# Patient Record
Sex: Male | Born: 1945
Health system: Southern US, Community
[De-identification: ages and names within clinical notes are randomized; demographics above are authoritative.]

## PROBLEM LIST (undated history)

## (undated) DIAGNOSIS — H9190 Unspecified hearing loss, unspecified ear: Secondary | ICD-10-CM

## (undated) DIAGNOSIS — G4730 Sleep apnea, unspecified: Secondary | ICD-10-CM

## (undated) DIAGNOSIS — H3322 Serous retinal detachment, left eye: Secondary | ICD-10-CM

## (undated) DIAGNOSIS — C61 Malignant neoplasm of prostate: Secondary | ICD-10-CM

## (undated) DIAGNOSIS — M1990 Unspecified osteoarthritis, unspecified site: Secondary | ICD-10-CM

## (undated) DIAGNOSIS — E1129 Type 2 diabetes mellitus with other diabetic kidney complication: Secondary | ICD-10-CM

## (undated) DIAGNOSIS — H409 Unspecified glaucoma: Secondary | ICD-10-CM

## (undated) DIAGNOSIS — R112 Nausea with vomiting, unspecified: Secondary | ICD-10-CM

## (undated) DIAGNOSIS — E785 Hyperlipidemia, unspecified: Secondary | ICD-10-CM

## (undated) DIAGNOSIS — Z87891 Personal history of nicotine dependence: Secondary | ICD-10-CM

## (undated) DIAGNOSIS — I10 Essential (primary) hypertension: Secondary | ICD-10-CM

## (undated) DIAGNOSIS — Z9889 Other specified postprocedural states: Secondary | ICD-10-CM

## (undated) DIAGNOSIS — K219 Gastro-esophageal reflux disease without esophagitis: Secondary | ICD-10-CM

## (undated) DIAGNOSIS — E119 Type 2 diabetes mellitus without complications: Secondary | ICD-10-CM

## (undated) HISTORY — DX: Hyperlipidemia, unspecified: E78.5

## (undated) HISTORY — DX: Personal history of nicotine dependence: Z87.891

## (undated) HISTORY — DX: Unspecified glaucoma: H40.9

## (undated) HISTORY — DX: Type 2 diabetes mellitus with other diabetic kidney complication: E11.29

## (undated) HISTORY — PX: CATARACT EXTRACTION, BILATERAL: SHX1313

## (undated) HISTORY — PX: EYE SURGERY: SHX253

## (undated) HISTORY — PX: CHOLECYSTECTOMY: SHX55

## (undated) HISTORY — DX: Type 2 diabetes mellitus without complications: E11.9

## (undated) HISTORY — PX: VASECTOMY: SHX75

## (undated) HISTORY — PX: HERNIA REPAIR: SHX51

## (undated) HISTORY — PX: RETINAL DETACHMENT SURGERY: SHX105

---

## 1999-08-17 ENCOUNTER — Encounter: Payer: Self-pay | Admitting: Ophthalmology

## 1999-08-18 ENCOUNTER — Ambulatory Visit (HOSPITAL_COMMUNITY): Admission: RE | Admit: 1999-08-18 | Discharge: 1999-08-19 | Payer: Self-pay | Admitting: Ophthalmology

## 1999-10-14 ENCOUNTER — Ambulatory Visit (HOSPITAL_COMMUNITY): Admission: RE | Admit: 1999-10-14 | Discharge: 1999-10-15 | Payer: Self-pay | Admitting: Ophthalmology

## 2002-08-27 ENCOUNTER — Ambulatory Visit (HOSPITAL_BASED_OUTPATIENT_CLINIC_OR_DEPARTMENT_OTHER): Admission: RE | Admit: 2002-08-27 | Discharge: 2002-08-27 | Payer: Self-pay | Admitting: Family Medicine

## 2015-06-21 DIAGNOSIS — K219 Gastro-esophageal reflux disease without esophagitis: Secondary | ICD-10-CM | POA: Diagnosis not present

## 2015-06-21 DIAGNOSIS — I10 Essential (primary) hypertension: Secondary | ICD-10-CM | POA: Diagnosis not present

## 2015-07-02 DIAGNOSIS — H26491 Other secondary cataract, right eye: Secondary | ICD-10-CM | POA: Diagnosis not present

## 2015-07-02 DIAGNOSIS — H40053 Ocular hypertension, bilateral: Secondary | ICD-10-CM | POA: Diagnosis not present

## 2015-07-26 DIAGNOSIS — Z79899 Other long term (current) drug therapy: Secondary | ICD-10-CM | POA: Diagnosis not present

## 2015-07-26 DIAGNOSIS — I10 Essential (primary) hypertension: Secondary | ICD-10-CM | POA: Diagnosis not present

## 2015-08-30 DIAGNOSIS — M545 Low back pain: Secondary | ICD-10-CM | POA: Diagnosis not present

## 2015-08-30 DIAGNOSIS — M5136 Other intervertebral disc degeneration, lumbar region: Secondary | ICD-10-CM | POA: Diagnosis not present

## 2015-10-25 DIAGNOSIS — M545 Low back pain: Secondary | ICD-10-CM | POA: Diagnosis not present

## 2015-10-25 DIAGNOSIS — Z79899 Other long term (current) drug therapy: Secondary | ICD-10-CM | POA: Diagnosis not present

## 2015-10-25 DIAGNOSIS — I10 Essential (primary) hypertension: Secondary | ICD-10-CM | POA: Diagnosis not present

## 2015-10-29 DIAGNOSIS — M545 Low back pain: Secondary | ICD-10-CM | POA: Diagnosis not present

## 2015-11-10 DIAGNOSIS — M545 Low back pain: Secondary | ICD-10-CM | POA: Diagnosis not present

## 2015-11-22 DIAGNOSIS — M545 Low back pain: Secondary | ICD-10-CM | POA: Diagnosis not present

## 2015-12-01 DIAGNOSIS — M5136 Other intervertebral disc degeneration, lumbar region: Secondary | ICD-10-CM | POA: Diagnosis not present

## 2015-12-01 DIAGNOSIS — M545 Low back pain: Secondary | ICD-10-CM | POA: Diagnosis not present

## 2015-12-03 DIAGNOSIS — M545 Low back pain: Secondary | ICD-10-CM | POA: Diagnosis not present

## 2015-12-03 DIAGNOSIS — M5136 Other intervertebral disc degeneration, lumbar region: Secondary | ICD-10-CM | POA: Diagnosis not present

## 2015-12-07 DIAGNOSIS — M5136 Other intervertebral disc degeneration, lumbar region: Secondary | ICD-10-CM | POA: Diagnosis not present

## 2015-12-07 DIAGNOSIS — M545 Low back pain: Secondary | ICD-10-CM | POA: Diagnosis not present

## 2015-12-14 DIAGNOSIS — M545 Low back pain: Secondary | ICD-10-CM | POA: Diagnosis not present

## 2016-03-31 DIAGNOSIS — E785 Hyperlipidemia, unspecified: Secondary | ICD-10-CM | POA: Diagnosis not present

## 2016-03-31 DIAGNOSIS — Z79899 Other long term (current) drug therapy: Secondary | ICD-10-CM | POA: Diagnosis not present

## 2016-03-31 DIAGNOSIS — I10 Essential (primary) hypertension: Secondary | ICD-10-CM | POA: Diagnosis not present

## 2016-03-31 DIAGNOSIS — Z125 Encounter for screening for malignant neoplasm of prostate: Secondary | ICD-10-CM | POA: Diagnosis not present

## 2016-03-31 DIAGNOSIS — K219 Gastro-esophageal reflux disease without esophagitis: Secondary | ICD-10-CM | POA: Diagnosis not present

## 2016-03-31 DIAGNOSIS — R7301 Impaired fasting glucose: Secondary | ICD-10-CM | POA: Diagnosis not present

## 2016-04-04 DIAGNOSIS — H524 Presbyopia: Secondary | ICD-10-CM | POA: Diagnosis not present

## 2016-04-04 DIAGNOSIS — H26491 Other secondary cataract, right eye: Secondary | ICD-10-CM | POA: Diagnosis not present

## 2016-04-04 DIAGNOSIS — H40053 Ocular hypertension, bilateral: Secondary | ICD-10-CM | POA: Diagnosis not present

## 2016-05-25 DIAGNOSIS — R972 Elevated prostate specific antigen [PSA]: Secondary | ICD-10-CM | POA: Diagnosis not present

## 2016-05-30 DIAGNOSIS — H40053 Ocular hypertension, bilateral: Secondary | ICD-10-CM | POA: Diagnosis not present

## 2016-05-30 DIAGNOSIS — H26491 Other secondary cataract, right eye: Secondary | ICD-10-CM | POA: Diagnosis not present

## 2016-07-28 DIAGNOSIS — N400 Enlarged prostate without lower urinary tract symptoms: Secondary | ICD-10-CM | POA: Diagnosis not present

## 2016-07-28 DIAGNOSIS — R972 Elevated prostate specific antigen [PSA]: Secondary | ICD-10-CM | POA: Diagnosis not present

## 2016-08-03 DIAGNOSIS — H52209 Unspecified astigmatism, unspecified eye: Secondary | ICD-10-CM | POA: Diagnosis not present

## 2016-08-03 DIAGNOSIS — H524 Presbyopia: Secondary | ICD-10-CM | POA: Diagnosis not present

## 2016-08-03 DIAGNOSIS — Z0100 Encounter for examination of eyes and vision without abnormal findings: Secondary | ICD-10-CM | POA: Diagnosis not present

## 2016-08-03 DIAGNOSIS — H5213 Myopia, bilateral: Secondary | ICD-10-CM | POA: Diagnosis not present

## 2016-09-04 DIAGNOSIS — C61 Malignant neoplasm of prostate: Secondary | ICD-10-CM | POA: Diagnosis not present

## 2016-09-04 DIAGNOSIS — R972 Elevated prostate specific antigen [PSA]: Secondary | ICD-10-CM | POA: Diagnosis not present

## 2016-09-22 DIAGNOSIS — C61 Malignant neoplasm of prostate: Secondary | ICD-10-CM | POA: Diagnosis not present

## 2016-10-20 DIAGNOSIS — C61 Malignant neoplasm of prostate: Secondary | ICD-10-CM | POA: Diagnosis not present

## 2016-10-20 DIAGNOSIS — R0683 Snoring: Secondary | ICD-10-CM | POA: Diagnosis not present

## 2016-10-20 DIAGNOSIS — Z79899 Other long term (current) drug therapy: Secondary | ICD-10-CM | POA: Diagnosis not present

## 2016-10-20 DIAGNOSIS — K219 Gastro-esophageal reflux disease without esophagitis: Secondary | ICD-10-CM | POA: Diagnosis not present

## 2016-10-20 DIAGNOSIS — I10 Essential (primary) hypertension: Secondary | ICD-10-CM | POA: Diagnosis not present

## 2016-11-19 DIAGNOSIS — G4733 Obstructive sleep apnea (adult) (pediatric): Secondary | ICD-10-CM | POA: Diagnosis not present

## 2016-11-29 DIAGNOSIS — H40053 Ocular hypertension, bilateral: Secondary | ICD-10-CM | POA: Diagnosis not present

## 2016-11-29 DIAGNOSIS — H26491 Other secondary cataract, right eye: Secondary | ICD-10-CM | POA: Diagnosis not present

## 2017-01-01 DIAGNOSIS — G4733 Obstructive sleep apnea (adult) (pediatric): Secondary | ICD-10-CM | POA: Diagnosis not present

## 2017-01-31 DIAGNOSIS — G4733 Obstructive sleep apnea (adult) (pediatric): Secondary | ICD-10-CM | POA: Diagnosis not present

## 2017-02-16 DIAGNOSIS — G4733 Obstructive sleep apnea (adult) (pediatric): Secondary | ICD-10-CM | POA: Diagnosis not present

## 2017-02-19 DIAGNOSIS — M545 Low back pain: Secondary | ICD-10-CM | POA: Diagnosis not present

## 2017-02-19 DIAGNOSIS — G8929 Other chronic pain: Secondary | ICD-10-CM | POA: Diagnosis not present

## 2017-02-19 DIAGNOSIS — M5136 Other intervertebral disc degeneration, lumbar region: Secondary | ICD-10-CM | POA: Diagnosis not present

## 2017-03-03 DIAGNOSIS — G4733 Obstructive sleep apnea (adult) (pediatric): Secondary | ICD-10-CM | POA: Diagnosis not present

## 2017-03-13 DIAGNOSIS — M545 Low back pain: Secondary | ICD-10-CM | POA: Diagnosis not present

## 2017-03-26 DIAGNOSIS — M545 Low back pain: Secondary | ICD-10-CM | POA: Diagnosis not present

## 2017-03-26 DIAGNOSIS — M5136 Other intervertebral disc degeneration, lumbar region: Secondary | ICD-10-CM | POA: Diagnosis not present

## 2017-03-26 DIAGNOSIS — G8929 Other chronic pain: Secondary | ICD-10-CM | POA: Diagnosis not present

## 2017-04-09 DIAGNOSIS — M545 Low back pain: Secondary | ICD-10-CM | POA: Diagnosis not present

## 2017-04-09 DIAGNOSIS — M5136 Other intervertebral disc degeneration, lumbar region: Secondary | ICD-10-CM | POA: Diagnosis not present

## 2017-04-17 DIAGNOSIS — M5136 Other intervertebral disc degeneration, lumbar region: Secondary | ICD-10-CM | POA: Diagnosis not present

## 2017-04-17 DIAGNOSIS — M545 Low back pain: Secondary | ICD-10-CM | POA: Diagnosis not present

## 2017-04-19 DIAGNOSIS — C61 Malignant neoplasm of prostate: Secondary | ICD-10-CM | POA: Diagnosis not present

## 2017-04-19 DIAGNOSIS — Z79899 Other long term (current) drug therapy: Secondary | ICD-10-CM | POA: Diagnosis not present

## 2017-04-19 DIAGNOSIS — K219 Gastro-esophageal reflux disease without esophagitis: Secondary | ICD-10-CM | POA: Diagnosis not present

## 2017-04-19 DIAGNOSIS — I10 Essential (primary) hypertension: Secondary | ICD-10-CM | POA: Diagnosis not present

## 2017-04-19 DIAGNOSIS — G4733 Obstructive sleep apnea (adult) (pediatric): Secondary | ICD-10-CM | POA: Diagnosis not present

## 2017-04-19 DIAGNOSIS — R7301 Impaired fasting glucose: Secondary | ICD-10-CM | POA: Diagnosis not present

## 2017-04-24 DIAGNOSIS — M5136 Other intervertebral disc degeneration, lumbar region: Secondary | ICD-10-CM | POA: Diagnosis not present

## 2017-04-24 DIAGNOSIS — M545 Low back pain: Secondary | ICD-10-CM | POA: Diagnosis not present

## 2017-05-01 DIAGNOSIS — M545 Low back pain: Secondary | ICD-10-CM | POA: Diagnosis not present

## 2017-05-01 DIAGNOSIS — M5136 Other intervertebral disc degeneration, lumbar region: Secondary | ICD-10-CM | POA: Diagnosis not present

## 2017-05-08 DIAGNOSIS — M545 Low back pain: Secondary | ICD-10-CM | POA: Diagnosis not present

## 2017-05-08 DIAGNOSIS — M5136 Other intervertebral disc degeneration, lumbar region: Secondary | ICD-10-CM | POA: Diagnosis not present

## 2017-05-16 DIAGNOSIS — M5136 Other intervertebral disc degeneration, lumbar region: Secondary | ICD-10-CM | POA: Diagnosis not present

## 2017-05-16 DIAGNOSIS — M545 Low back pain: Secondary | ICD-10-CM | POA: Diagnosis not present

## 2017-09-07 DIAGNOSIS — H40053 Ocular hypertension, bilateral: Secondary | ICD-10-CM | POA: Diagnosis not present

## 2017-09-07 DIAGNOSIS — H52223 Regular astigmatism, bilateral: Secondary | ICD-10-CM | POA: Diagnosis not present

## 2017-09-07 DIAGNOSIS — H26491 Other secondary cataract, right eye: Secondary | ICD-10-CM | POA: Diagnosis not present

## 2017-09-07 DIAGNOSIS — H524 Presbyopia: Secondary | ICD-10-CM | POA: Diagnosis not present

## 2017-09-17 DIAGNOSIS — H5213 Myopia, bilateral: Secondary | ICD-10-CM | POA: Diagnosis not present

## 2017-09-17 DIAGNOSIS — H524 Presbyopia: Secondary | ICD-10-CM | POA: Diagnosis not present

## 2017-09-17 DIAGNOSIS — H52209 Unspecified astigmatism, unspecified eye: Secondary | ICD-10-CM | POA: Diagnosis not present

## 2017-10-19 DIAGNOSIS — G4733 Obstructive sleep apnea (adult) (pediatric): Secondary | ICD-10-CM | POA: Diagnosis not present

## 2017-10-19 DIAGNOSIS — C61 Malignant neoplasm of prostate: Secondary | ICD-10-CM | POA: Diagnosis not present

## 2017-10-19 DIAGNOSIS — Z79899 Other long term (current) drug therapy: Secondary | ICD-10-CM | POA: Diagnosis not present

## 2017-10-19 DIAGNOSIS — E785 Hyperlipidemia, unspecified: Secondary | ICD-10-CM | POA: Diagnosis not present

## 2017-10-19 DIAGNOSIS — I10 Essential (primary) hypertension: Secondary | ICD-10-CM | POA: Diagnosis not present

## 2017-10-19 DIAGNOSIS — K219 Gastro-esophageal reflux disease without esophagitis: Secondary | ICD-10-CM | POA: Diagnosis not present

## 2017-12-18 DIAGNOSIS — H6123 Impacted cerumen, bilateral: Secondary | ICD-10-CM | POA: Diagnosis not present

## 2018-01-30 DIAGNOSIS — M7989 Other specified soft tissue disorders: Secondary | ICD-10-CM | POA: Diagnosis not present

## 2018-01-30 DIAGNOSIS — M542 Cervicalgia: Secondary | ICD-10-CM | POA: Diagnosis not present

## 2018-01-30 DIAGNOSIS — I10 Essential (primary) hypertension: Secondary | ICD-10-CM | POA: Diagnosis not present

## 2018-01-30 DIAGNOSIS — M48062 Spinal stenosis, lumbar region with neurogenic claudication: Secondary | ICD-10-CM | POA: Diagnosis not present

## 2018-01-31 DIAGNOSIS — I998 Other disorder of circulatory system: Secondary | ICD-10-CM | POA: Diagnosis not present

## 2018-01-31 DIAGNOSIS — M542 Cervicalgia: Secondary | ICD-10-CM | POA: Diagnosis not present

## 2018-01-31 DIAGNOSIS — M47812 Spondylosis without myelopathy or radiculopathy, cervical region: Secondary | ICD-10-CM | POA: Diagnosis not present

## 2018-02-25 DIAGNOSIS — M549 Dorsalgia, unspecified: Secondary | ICD-10-CM | POA: Diagnosis not present

## 2018-02-25 DIAGNOSIS — I10 Essential (primary) hypertension: Secondary | ICD-10-CM | POA: Diagnosis not present

## 2018-02-25 DIAGNOSIS — M542 Cervicalgia: Secondary | ICD-10-CM | POA: Diagnosis not present

## 2018-02-25 DIAGNOSIS — Z79899 Other long term (current) drug therapy: Secondary | ICD-10-CM | POA: Diagnosis not present

## 2018-02-26 DIAGNOSIS — I6523 Occlusion and stenosis of bilateral carotid arteries: Secondary | ICD-10-CM | POA: Diagnosis not present

## 2018-04-08 DIAGNOSIS — M545 Low back pain: Secondary | ICD-10-CM | POA: Diagnosis not present

## 2018-04-08 DIAGNOSIS — I10 Essential (primary) hypertension: Secondary | ICD-10-CM | POA: Diagnosis not present

## 2018-04-08 DIAGNOSIS — M4726 Other spondylosis with radiculopathy, lumbar region: Secondary | ICD-10-CM | POA: Diagnosis not present

## 2018-04-08 DIAGNOSIS — M5136 Other intervertebral disc degeneration, lumbar region: Secondary | ICD-10-CM | POA: Diagnosis not present

## 2018-05-01 DIAGNOSIS — K027 Dental root caries: Secondary | ICD-10-CM | POA: Diagnosis not present

## 2018-05-06 DIAGNOSIS — Z6827 Body mass index (BMI) 27.0-27.9, adult: Secondary | ICD-10-CM | POA: Diagnosis not present

## 2018-05-06 DIAGNOSIS — E663 Overweight: Secondary | ICD-10-CM | POA: Diagnosis not present

## 2018-05-06 DIAGNOSIS — M5441 Lumbago with sciatica, right side: Secondary | ICD-10-CM | POA: Diagnosis not present

## 2018-05-08 DIAGNOSIS — M4726 Other spondylosis with radiculopathy, lumbar region: Secondary | ICD-10-CM | POA: Diagnosis not present

## 2018-05-08 DIAGNOSIS — M48061 Spinal stenosis, lumbar region without neurogenic claudication: Secondary | ICD-10-CM | POA: Diagnosis not present

## 2018-05-08 DIAGNOSIS — M545 Low back pain: Secondary | ICD-10-CM | POA: Diagnosis not present

## 2018-05-08 DIAGNOSIS — M5136 Other intervertebral disc degeneration, lumbar region: Secondary | ICD-10-CM | POA: Diagnosis not present

## 2018-05-21 DIAGNOSIS — K027 Dental root caries: Secondary | ICD-10-CM | POA: Diagnosis not present

## 2018-05-27 DIAGNOSIS — M545 Low back pain: Secondary | ICD-10-CM | POA: Diagnosis not present

## 2018-06-13 DIAGNOSIS — M5416 Radiculopathy, lumbar region: Secondary | ICD-10-CM | POA: Insufficient documentation

## 2018-07-08 DIAGNOSIS — M5416 Radiculopathy, lumbar region: Secondary | ICD-10-CM | POA: Diagnosis not present

## 2018-07-08 DIAGNOSIS — E7800 Pure hypercholesterolemia, unspecified: Secondary | ICD-10-CM | POA: Insufficient documentation

## 2018-07-08 DIAGNOSIS — I10 Essential (primary) hypertension: Secondary | ICD-10-CM | POA: Insufficient documentation

## 2018-07-09 DIAGNOSIS — Z01818 Encounter for other preprocedural examination: Secondary | ICD-10-CM | POA: Diagnosis not present

## 2018-07-09 DIAGNOSIS — Z6827 Body mass index (BMI) 27.0-27.9, adult: Secondary | ICD-10-CM | POA: Diagnosis not present

## 2018-07-09 DIAGNOSIS — I10 Essential (primary) hypertension: Secondary | ICD-10-CM | POA: Diagnosis not present

## 2018-08-05 DIAGNOSIS — M5416 Radiculopathy, lumbar region: Secondary | ICD-10-CM | POA: Diagnosis not present

## 2018-08-05 NOTE — Pre-Procedure Instructions (Signed)
Troy Wu  08/05/2018      [id number] - [place], [place] [address] [place] [address] Phone: [phone] Fax: [phone]    Your procedure is scheduled on November 27th.  Report to [facility] Admitting at 0830 A.M.  Call this number if you have problems the morning of surgery:  [phone]   Remember:  Do not eat or drink after midnight.    Take these medicines the morning of surgery with A SIP OF WATER   amLODipine (NORVASC)  cetirizine (KLS ALLER-TEC)  omeprazole (PRILOSEC)  tamsulosin (FLOMAX)  timolol (BETIMOL)   7 days prior to surgery STOP taking any Aspirin(unless otherwise instructed by your surgeon), Aleve, Naproxen, Ibuprofen, Motrin, Advil, Goody's, BC's, all herbal medications, fish oil, and all vitamins     Do not wear jewelry.  Do not wear lotions, powders, or colognes, or deodorant.  Men may shave face and neck.  Do not bring valuables to the hospital.  [facility] is not responsible for any belongings or valuables.  Contacts, dentures or bridgework may not be worn into surgery.  Leave your suitcase in the car.  After surgery it may be brought to your room.  For patients admitted to the hospital, discharge time will be determined by your treatment team.  Patients discharged the day of surgery will not be allowed to drive home.    [facility]- Preparing For Surgery  Before surgery, you can play an important role. Because skin is not sterile, your skin needs to be as free of germs as possible. You can reduce the number of germs on your skin by washing with CHG (chlorahexidine gluconate) Soap before surgery.  CHG is an antiseptic cleaner which kills germs and bonds with the skin to continue killing germs even after washing.    Oral Hygiene is also important to reduce your risk of infection.  Remember - BRUSH YOUR TEETH THE MORNING OF SURGERY WITH YOUR REGULAR  TOOTHPASTE  Please do not use if you have an allergy to CHG or antibacterial soaps. If your skin becomes reddened/irritated stop using the CHG.  Do not shave (including legs and underarms) for at least 48 hours prior to first CHG shower. It is OK to shave your face.  Please follow these instructions carefully.   1. Shower the NIGHT BEFORE SURGERY and the MORNING OF SURGERY with CHG.   2. If you chose to wash your hair, wash your hair first as usual with your normal shampoo.  3. After you shampoo, rinse your hair and body thoroughly to remove the shampoo.  4. Use CHG as you would any other liquid soap. You can apply CHG directly to the skin and wash gently with a scrungie or a clean washcloth.   5. Apply the CHG Soap to your body ONLY FROM THE NECK DOWN.  Do not use on open wounds or open sores. Avoid contact with your eyes, ears, mouth and genitals (private parts). Wash Face and genitals (private parts)  with your normal soap.  6. Wash thoroughly, paying special attention to the area where your surgery will be performed.  7. Thoroughly rinse your body with warm water from the neck down.  8. DO NOT shower/wash with your normal soap after using and rinsing off the CHG Soap.  9. Pat yourself dry with a CLEAN TOWEL.  10. Wear CLEAN PAJAMAS to bed the night before surgery, wear comfortable  clothes the morning of surgery  11. Place CLEAN SHEETS on your bed the night of your first shower and DO NOT SLEEP WITH PETS.    Day of Surgery:  Do not apply any deodorants/lotions.  Please wear clean clothes to the hospital/surgery center.   Remember to brush your teeth WITH YOUR REGULAR TOOTHPASTE.    Please read over the following fact sheets that you were given.

## 2018-08-06 ENCOUNTER — Other Ambulatory Visit: Payer: Self-pay

## 2018-08-06 ENCOUNTER — Encounter (HOSPITAL_COMMUNITY)
Admission: RE | Admit: 2018-08-06 | Discharge: 2018-08-06 | Disposition: A | Discharge status: Home / Self Care | Payer: Medicare HMO | Source: Ambulatory Visit | Attending: Orthopedic Surgery | Admitting: Orthopedic Surgery

## 2018-08-06 ENCOUNTER — Encounter (HOSPITAL_COMMUNITY): Payer: Self-pay

## 2018-08-06 DIAGNOSIS — Z01812 Encounter for preprocedural laboratory examination: Secondary | ICD-10-CM | POA: Insufficient documentation

## 2018-08-06 HISTORY — DX: Serous retinal detachment, left eye: H33.22

## 2018-08-06 HISTORY — DX: Gastro-esophageal reflux disease without esophagitis: K21.9

## 2018-08-06 HISTORY — DX: Other specified postprocedural states: R11.2

## 2018-08-06 HISTORY — DX: Unspecified hearing loss, unspecified ear: H91.90

## 2018-08-06 HISTORY — DX: Unspecified osteoarthritis, unspecified site: M19.90

## 2018-08-06 HISTORY — DX: Sleep apnea, unspecified: G47.30

## 2018-08-06 HISTORY — DX: Malignant neoplasm of prostate: C61

## 2018-08-06 HISTORY — DX: Essential (primary) hypertension: I10

## 2018-08-06 HISTORY — DX: Nausea with vomiting, unspecified: Z98.890

## 2018-08-06 LAB — CBC
HEMATOCRIT: 42.8 % (ref 39.0–52.0)
Hemoglobin: 14.1 g/dL (ref 13.0–17.0)
MCH: 31.1 pg (ref 26.0–34.0)
MCHC: 32.9 g/dL (ref 30.0–36.0)
MCV: 94.5 fL (ref 80.0–100.0)
NRBC: 0 % (ref 0.0–0.2)
Platelets: 192 10*3/uL (ref 150–400)
RBC: 4.53 MIL/uL (ref 4.22–5.81)
RDW: 12 % (ref 11.5–15.5)
WBC: 8 10*3/uL (ref 4.0–10.5)

## 2018-08-06 LAB — SURGICAL PCR SCREEN
MRSA, PCR: NEGATIVE
STAPHYLOCOCCUS AUREUS: POSITIVE — AB

## 2018-08-06 LAB — BASIC METABOLIC PANEL
Anion gap: 8 (ref 5–15)
BUN: 22 mg/dL (ref 8–23)
CHLORIDE: 109 mmol/L (ref 98–111)
CO2: 19 mmol/L — AB (ref 22–32)
CREATININE: 0.97 mg/dL (ref 0.61–1.24)
Calcium: 9.5 mg/dL (ref 8.9–10.3)
GFR calc non Af Amer: >60 mL/min (ref >60)
GLUCOSE: 147 mg/dL — AB (ref 70–99)
Potassium: 4.1 mmol/L (ref 3.5–5.1)
SODIUM: 136 mmol/L (ref 135–145)

## 2018-08-06 NOTE — Progress Notes (Signed)
PCP: Dr. Sherlie Ban Cardiologist: denies  EKG: requested from PCP--will need DOS if records not received Denies CXR, cardiac cath, stress test  Sleep study:  Pt reports he had a study "some years ago, unable to remember exactly."  Used a CPAP machine for a week, but was unable to tolerate. Requested records from PCP, unable to remember exact location.   Patient denies shortness of breath, fever, cough, and chest pain at PAT appointment.  Patient verbalized understanding of instructions provided today at the PAT appointment.  Patient asked to review instructions at home and day of surgery.

## 2018-08-06 NOTE — Progress Notes (Signed)
Prescription for Mupirocin ointment called to [facility] Dr.  Abbott Pao called and made aware to pick up ointment, stated would be picked up in "a few days" when they get back from [facility].

## 2018-08-09 NOTE — Anesthesia Preprocedure Evaluation (Addendum)
Anesthesia Evaluation  Patient identified by MRN, date of birth, ID band Patient awake    Reviewed: Allergy & Precautions, NPO status , Patient's Chart, lab work & pertinent test results  History of Anesthesia Complications (+) PONV and history of anesthetic complications  Airway Mallampati: II  TM Distance: >3 FB Neck ROM: Full    Dental  (+) Edentulous Upper, Edentulous Lower   Pulmonary sleep apnea (noncompliant) , former smoker,    breath sounds clear to auscultation       Cardiovascular hypertension, Pt. on medications (-) angina Rhythm:Regular Rate:Normal     Neuro/Psych  Hearing aids b/l  negative neurological ROS  negative psych ROS   GI/Hepatic Neg liver ROS, GERD  Medicated and Controlled,  Endo/Other  negative endocrine ROS  Renal/GU negative Renal ROS    Prostate cancer     Musculoskeletal  (+) Arthritis ,   Abdominal   Peds  Hematology negative hematology ROS (+)   Anesthesia Other Findings Hx detached retina x 2   Reproductive/Obstetrics                           Anesthesia Physical Anesthesia Plan  ASA: II  Anesthesia Plan: General   Post-op Pain Management:    Induction: Intravenous  PONV Risk Score and Plan: 3 and Treatment may vary due to age or medical condition, Ondansetron and Dexamethasone  Airway Management Planned: Oral ETT  Additional Equipment: None  Intra-op Plan:   Post-operative Plan: Extubation in OR  Informed Consent: I have reviewed the patients History and Physical, chart, labs and discussed the procedure including the risks, benefits and alternatives for the proposed anesthesia with the patient or authorized representative who has indicated his/her understanding and acceptance.   Dental advisory given  Plan Discussed with: CRNA and Anesthesiologist  Anesthesia Plan Comments:       Anesthesia Quick Evaluation

## 2018-08-14 ENCOUNTER — Observation Stay (HOSPITAL_COMMUNITY)
Admission: RE | Admit: 2018-08-14 | Discharge: 2018-08-14 | Disposition: A | Discharge status: Home / Self Care | Payer: Medicare HMO | Source: Ambulatory Visit | Attending: Orthopedic Surgery | Admitting: Orthopedic Surgery

## 2018-08-14 ENCOUNTER — Ambulatory Visit (HOSPITAL_COMMUNITY): Payer: Medicare HMO

## 2018-08-14 ENCOUNTER — Ambulatory Visit (HOSPITAL_COMMUNITY): Payer: Medicare HMO | Admitting: Anesthesiology

## 2018-08-14 ENCOUNTER — Ambulatory Visit (HOSPITAL_COMMUNITY): Payer: Medicare HMO | Admitting: Physician Assistant

## 2018-08-14 ENCOUNTER — Encounter (HOSPITAL_COMMUNITY): Payer: Self-pay

## 2018-08-14 ENCOUNTER — Encounter (HOSPITAL_COMMUNITY)
Admission: RE | Disposition: A | Discharge status: Home / Self Care | Payer: Self-pay | Source: Ambulatory Visit | Attending: Orthopedic Surgery

## 2018-08-14 ENCOUNTER — Other Ambulatory Visit: Payer: Self-pay

## 2018-08-14 DIAGNOSIS — Z981 Arthrodesis status: Secondary | ICD-10-CM | POA: Diagnosis not present

## 2018-08-14 DIAGNOSIS — K219 Gastro-esophageal reflux disease without esophagitis: Secondary | ICD-10-CM | POA: Insufficient documentation

## 2018-08-14 DIAGNOSIS — M5126 Other intervertebral disc displacement, lumbar region: Secondary | ICD-10-CM | POA: Insufficient documentation

## 2018-08-14 DIAGNOSIS — Z791 Long term (current) use of non-steroidal anti-inflammatories (NSAID): Secondary | ICD-10-CM | POA: Insufficient documentation

## 2018-08-14 DIAGNOSIS — Z87891 Personal history of nicotine dependence: Secondary | ICD-10-CM | POA: Insufficient documentation

## 2018-08-14 DIAGNOSIS — M48061 Spinal stenosis, lumbar region without neurogenic claudication: Secondary | ICD-10-CM | POA: Insufficient documentation

## 2018-08-14 DIAGNOSIS — I10 Essential (primary) hypertension: Secondary | ICD-10-CM | POA: Diagnosis not present

## 2018-08-14 DIAGNOSIS — Z419 Encounter for procedure for purposes other than remedying health state, unspecified: Secondary | ICD-10-CM

## 2018-08-14 DIAGNOSIS — M5116 Intervertebral disc disorders with radiculopathy, lumbar region: Secondary | ICD-10-CM | POA: Diagnosis not present

## 2018-08-14 DIAGNOSIS — Z79899 Other long term (current) drug therapy: Secondary | ICD-10-CM | POA: Diagnosis not present

## 2018-08-14 HISTORY — PX: LUMBAR LAMINECTOMY/DECOMPRESSION MICRODISCECTOMY: SHX5026

## 2018-08-14 SURGERY — LUMBAR LAMINECTOMY/DECOMPRESSION MICRODISCECTOMY 1 LEVEL
Anesthesia: General | Site: Back | Laterality: Right

## 2018-08-14 MED ORDER — ROCURONIUM BROMIDE 50 MG/5ML IV SOSY
PREFILLED_SYRINGE | INTRAVENOUS | Status: AC
  Filled 2018-08-14: qty 5

## 2018-08-14 MED ORDER — PROPOFOL 10 MG/ML IV BOLUS
INTRAVENOUS | Status: AC
  Filled 2018-08-14: qty 40

## 2018-08-14 MED ORDER — PHENOL 1.4 % MT LIQD: 1.0000 | OROMUCOSAL | Status: DC | PRN

## 2018-08-14 MED ORDER — CEFAZOLIN SODIUM-DEXTROSE 1-4 GM/50ML-% IV SOLN
1.0000 g | Freq: Three times a day (TID) | INTRAVENOUS | Status: DC
  Administered 2018-08-14: 1 g via INTRAVENOUS
  Filled 2018-08-14: qty 50

## 2018-08-14 MED ORDER — AMLODIPINE BESYLATE 5 MG PO TABS: 5.0000 mg | ORAL_TABLET | Freq: Every day | ORAL | Status: DC

## 2018-08-14 MED ORDER — ACETAMINOPHEN 650 MG RE SUPP: 650.0000 mg | RECTAL | Status: DC | PRN

## 2018-08-14 MED ORDER — ONDANSETRON HCL 4 MG/2ML IJ SOLN
INTRAMUSCULAR | Status: DC | PRN
  Administered 2018-08-14: 4 mg via INTRAVENOUS

## 2018-08-14 MED ORDER — TAMSULOSIN HCL 0.4 MG PO CAPS
0.4000 mg | ORAL_CAPSULE | Freq: Every day | ORAL | Status: DC
  Filled 2018-08-14: qty 1

## 2018-08-14 MED ORDER — DEXAMETHASONE SODIUM PHOSPHATE 10 MG/ML IJ SOLN
INTRAMUSCULAR | Status: DC | PRN
  Administered 2018-08-14: 10 mg via INTRAVENOUS

## 2018-08-14 MED ORDER — OXYCODONE HCL 5 MG PO TABS
10.0000 mg | ORAL_TABLET | ORAL | Status: DC | PRN
  Administered 2018-08-14 (×2): 10 mg via ORAL
  Filled 2018-08-14: qty 2

## 2018-08-14 MED ORDER — ACETAMINOPHEN 325 MG PO TABS: 650.0000 mg | ORAL_TABLET | ORAL | Status: DC | PRN

## 2018-08-14 MED ORDER — MIDAZOLAM HCL 5 MG/5ML IJ SOLN
INTRAMUSCULAR | Status: DC | PRN
  Administered 2018-08-14: 1 mg via INTRAVENOUS

## 2018-08-14 MED ORDER — ONDANSETRON HCL 4 MG/2ML IJ SOLN: 4.0000 mg | Freq: Once | INTRAMUSCULAR | Status: DC | PRN

## 2018-08-14 MED ORDER — IRBESARTAN 300 MG PO TABS
300.0000 mg | ORAL_TABLET | Freq: Every day | ORAL | Status: DC
  Filled 2018-08-14: qty 1

## 2018-08-14 MED ORDER — DEXAMETHASONE SODIUM PHOSPHATE 10 MG/ML IJ SOLN
INTRAMUSCULAR | Status: AC
  Filled 2018-08-14: qty 1

## 2018-08-14 MED ORDER — OXYCODONE HCL 5 MG/5ML PO SOLN: 5.0000 mg | Freq: Once | ORAL | Status: DC | PRN

## 2018-08-14 MED ORDER — FENTANYL CITRATE (PF) 100 MCG/2ML IJ SOLN
INTRAMUSCULAR | Status: DC | PRN
  Administered 2018-08-14: 50 ug via INTRAVENOUS

## 2018-08-14 MED ORDER — BUPIVACAINE-EPINEPHRINE (PF) 0.25% -1:200000 IJ SOLN
INTRAMUSCULAR | Status: AC
  Filled 2018-08-14: qty 30

## 2018-08-14 MED ORDER — OXYCODONE HCL 5 MG PO TABS: 5.0000 mg | ORAL_TABLET | Freq: Once | ORAL | Status: DC | PRN

## 2018-08-14 MED ORDER — LIDOCAINE 2% (20 MG/ML) 5 ML SYRINGE
INTRAMUSCULAR | Status: DC | PRN
  Administered 2018-08-14: 80 mg via INTRAVENOUS

## 2018-08-14 MED ORDER — 0.9 % SODIUM CHLORIDE (POUR BTL) OPTIME
TOPICAL | Status: DC | PRN
  Administered 2018-08-14: 1000 mL

## 2018-08-14 MED ORDER — THROMBIN (RECOMBINANT) 20000 UNITS EX SOLR
CUTANEOUS | Status: AC
  Filled 2018-08-14: qty 20000

## 2018-08-14 MED ORDER — CEFAZOLIN SODIUM-DEXTROSE 2-4 GM/100ML-% IV SOLN
2.0000 g | INTRAVENOUS | Status: AC
  Administered 2018-08-14: 2 g via INTRAVENOUS
  Filled 2018-08-14: qty 100

## 2018-08-14 MED ORDER — BUPIVACAINE-EPINEPHRINE 0.25% -1:200000 IJ SOLN
INTRAMUSCULAR | Status: DC | PRN
  Administered 2018-08-14: 10 mL

## 2018-08-14 MED ORDER — EPHEDRINE 5 MG/ML INJ
INTRAVENOUS | Status: AC
  Filled 2018-08-14: qty 10

## 2018-08-14 MED ORDER — SUGAMMADEX SODIUM 200 MG/2ML IV SOLN
INTRAVENOUS | Status: DC | PRN
  Administered 2018-08-14: 170 mg via INTRAVENOUS

## 2018-08-14 MED ORDER — TRANEXAMIC ACID-NACL 1000-0.7 MG/100ML-% IV SOLN
INTRAVENOUS | Status: DC | PRN
  Administered 2018-08-14: 1000 mg via INTRAVENOUS

## 2018-08-14 MED ORDER — TRANEXAMIC ACID-NACL 1000-0.7 MG/100ML-% IV SOLN
INTRAVENOUS | Status: AC
  Filled 2018-08-14: qty 100

## 2018-08-14 MED ORDER — FENTANYL CITRATE (PF) 100 MCG/2ML IJ SOLN: 25.0000 ug | INTRAMUSCULAR | Status: DC | PRN

## 2018-08-14 MED ORDER — ONDANSETRON HCL 4 MG/2ML IJ SOLN
INTRAMUSCULAR | Status: AC
  Filled 2018-08-14: qty 2

## 2018-08-14 MED ORDER — LIDOCAINE 2% (20 MG/ML) 5 ML SYRINGE
INTRAMUSCULAR | Status: AC
  Filled 2018-08-14: qty 5

## 2018-08-14 MED ORDER — SODIUM CHLORIDE 0.9% FLUSH: 3.0000 mL | Freq: Two times a day (BID) | INTRAVENOUS | Status: DC

## 2018-08-14 MED ORDER — MIDAZOLAM HCL 2 MG/2ML IJ SOLN
INTRAMUSCULAR | Status: AC
  Filled 2018-08-14: qty 2

## 2018-08-14 MED ORDER — PHENYLEPHRINE 40 MCG/ML (10ML) SYRINGE FOR IV PUSH (FOR BLOOD PRESSURE SUPPORT)
PREFILLED_SYRINGE | INTRAVENOUS | Status: AC
  Filled 2018-08-14: qty 10

## 2018-08-14 MED ORDER — LACTATED RINGERS IV SOLN: INTRAVENOUS | Status: DC

## 2018-08-14 MED ORDER — PHENYLEPHRINE HCL 10 MG/ML IJ SOLN
INTRAMUSCULAR | Status: DC | PRN
  Administered 2018-08-14: 80 ug via INTRAVENOUS
  Administered 2018-08-14: 40 ug via INTRAVENOUS
  Administered 2018-08-14: 80 ug via INTRAVENOUS

## 2018-08-14 MED ORDER — METHYLPREDNISOLONE ACETATE 40 MG/ML IJ SUSP
INTRAMUSCULAR | Status: DC | PRN
  Administered 2018-08-14: 40 mg

## 2018-08-14 MED ORDER — METHYLPREDNISOLONE ACETATE 40 MG/ML IJ SUSP
INTRAMUSCULAR | Status: AC
  Filled 2018-08-14: qty 1

## 2018-08-14 MED ORDER — OXYCODONE HCL 5 MG PO TABS: 5.0000 mg | ORAL_TABLET | ORAL | Status: DC | PRN

## 2018-08-14 MED ORDER — FENTANYL CITRATE (PF) 250 MCG/5ML IJ SOLN
INTRAMUSCULAR | Status: AC
  Filled 2018-08-14: qty 5

## 2018-08-14 MED ORDER — ONDANSETRON HCL 4 MG PO TABS: 4.0000 mg | ORAL_TABLET | Freq: Four times a day (QID) | ORAL | Status: DC | PRN

## 2018-08-14 MED ORDER — HEMOSTATIC AGENTS (NO CHARGE) OPTIME
TOPICAL | Status: DC | PRN
  Administered 2018-08-14: 1

## 2018-08-14 MED ORDER — SPIRONOLACTONE 50 MG PO TABS
50.0000 mg | ORAL_TABLET | Freq: Every day | ORAL | Status: DC
  Administered 2018-08-14: 50 mg via ORAL
  Filled 2018-08-14: qty 1

## 2018-08-14 MED ORDER — LACTATED RINGERS IV SOLN
INTRAVENOUS | Status: DC | PRN
  Administered 2018-08-14: 07:00:00 via INTRAVENOUS

## 2018-08-14 MED ORDER — MENTHOL 3 MG MT LOZG: 1.0000 | LOZENGE | OROMUCOSAL | Status: DC | PRN

## 2018-08-14 MED ORDER — METHOCARBAMOL 1000 MG/10ML IJ SOLN
500.0000 mg | Freq: Four times a day (QID) | INTRAVENOUS | Status: DC | PRN
  Filled 2018-08-14: qty 5

## 2018-08-14 MED ORDER — MORPHINE SULFATE (PF) 2 MG/ML IV SOLN: 2.0000 mg | INTRAVENOUS | Status: DC | PRN

## 2018-08-14 MED ORDER — PROPOFOL 10 MG/ML IV BOLUS
INTRAVENOUS | Status: DC | PRN
  Administered 2018-08-14: 130 mg via INTRAVENOUS

## 2018-08-14 MED ORDER — SODIUM CHLORIDE 0.9 % IV SOLN: 250.0000 mL | INTRAVENOUS | Status: DC

## 2018-08-14 MED ORDER — ACETAMINOPHEN 10 MG/ML IV SOLN
INTRAVENOUS | Status: AC
  Filled 2018-08-14: qty 100

## 2018-08-14 MED ORDER — METHOCARBAMOL 500 MG PO TABS
ORAL_TABLET | ORAL | Status: AC
  Filled 2018-08-14: qty 1

## 2018-08-14 MED ORDER — ACETAMINOPHEN 10 MG/ML IV SOLN
INTRAVENOUS | Status: DC | PRN
  Administered 2018-08-14: 1000 mg via INTRAVENOUS

## 2018-08-14 MED ORDER — METHOCARBAMOL 500 MG PO TABS
500.0000 mg | ORAL_TABLET | Freq: Four times a day (QID) | ORAL | Status: DC | PRN
  Administered 2018-08-14: 500 mg via ORAL

## 2018-08-14 MED ORDER — OXYCODONE HCL 5 MG PO TABS
ORAL_TABLET | ORAL | Status: AC
  Filled 2018-08-14: qty 2

## 2018-08-14 MED ORDER — SODIUM CHLORIDE 0.9% FLUSH: 3.0000 mL | INTRAVENOUS | Status: DC | PRN

## 2018-08-14 MED ORDER — THROMBIN 20000 UNITS EX SOLR
CUTANEOUS | Status: DC | PRN
  Administered 2018-08-14: 20 mL via TOPICAL

## 2018-08-14 MED ORDER — ROCURONIUM BROMIDE 50 MG/5ML IV SOSY
PREFILLED_SYRINGE | INTRAVENOUS | Status: DC | PRN
  Administered 2018-08-14: 50 mg via INTRAVENOUS

## 2018-08-14 MED ORDER — EPHEDRINE SULFATE 50 MG/ML IJ SOLN
INTRAMUSCULAR | Status: DC | PRN
  Administered 2018-08-14: 10 mg via INTRAVENOUS

## 2018-08-14 MED ORDER — ONDANSETRON HCL 4 MG/2ML IJ SOLN: 4.0000 mg | Freq: Four times a day (QID) | INTRAMUSCULAR | Status: DC | PRN

## 2018-08-14 SURGICAL SUPPLY — 56 items
AGENT HMST KT MTR STRL THRMB (HEMOSTASIS) ×1
BNDG GAUZE ELAST 4 BULKY (GAUZE/BANDAGES/DRESSINGS) ×2 IMPLANT
CANISTER SUCT 3000ML PPV (MISCELLANEOUS) ×2 IMPLANT
CLSR STERI-STRIP ANTIMIC 1/2X4 (GAUZE/BANDAGES/DRESSINGS) ×2 IMPLANT
CORD BI POLAR (MISCELLANEOUS) ×2 IMPLANT
COVER SURGICAL LIGHT HANDLE (MISCELLANEOUS) ×2 IMPLANT
COVER WAND RF STERILE (DRAPES) ×2 IMPLANT
DRAPE POUCH INSTRU U-SHP 10X18 (DRAPES) ×2 IMPLANT
DRAPE SURG 17X23 STRL (DRAPES) ×2 IMPLANT
DRAPE U-SHAPE 47X51 STRL (DRAPES) ×2 IMPLANT
DRSG OPSITE POSTOP 3X4 (GAUZE/BANDAGES/DRESSINGS) ×2 IMPLANT
DRSG OPSITE POSTOP 4X6 (GAUZE/BANDAGES/DRESSINGS) ×1 IMPLANT
DURAPREP 26ML APPLICATOR (WOUND CARE) ×2 IMPLANT
ELECT BLADE 4.0 EZ CLEAN MEGAD (MISCELLANEOUS)
ELECT CAUTERY BLADE 6.4 (BLADE) ×2 IMPLANT
ELECT PENCIL ROCKER SW 15FT (MISCELLANEOUS) ×2 IMPLANT
ELECT REM PT RETURN 9FT ADLT (ELECTROSURGICAL) ×2
ELECTRODE BLDE 4.0 EZ CLN MEGD (MISCELLANEOUS) IMPLANT
ELECTRODE REM PT RTRN 9FT ADLT (ELECTROSURGICAL) ×1 IMPLANT
GLOVE BIO SURGEON STRL SZ 6.5 (GLOVE) ×2 IMPLANT
GLOVE BIOGEL PI IND STRL 6.5 (GLOVE) ×1 IMPLANT
GLOVE BIOGEL PI IND STRL 8.5 (GLOVE) ×1 IMPLANT
GLOVE BIOGEL PI INDICATOR 6.5 (GLOVE) ×1
GLOVE BIOGEL PI INDICATOR 8.5 (GLOVE) ×1
GLOVE SS BIOGEL STRL SZ 8.5 (GLOVE) ×1 IMPLANT
GLOVE SUPERSENSE BIOGEL SZ 8.5 (GLOVE) ×1
GOWN STRL REUS W/ TWL LRG LVL3 (GOWN DISPOSABLE) ×2 IMPLANT
GOWN STRL REUS W/TWL 2XL LVL3 (GOWN DISPOSABLE) ×2 IMPLANT
GOWN STRL REUS W/TWL LRG LVL3 (GOWN DISPOSABLE) ×4
KIT BASIN OR (CUSTOM PROCEDURE TRAY) ×2 IMPLANT
KIT TURNOVER KIT B (KITS) ×2 IMPLANT
NDL SPNL 18GX3.5 QUINCKE PK (NEEDLE) ×2 IMPLANT
NEEDLE 22X1 1/2 (OR ONLY) (NEEDLE) ×2 IMPLANT
NEEDLE SPNL 18GX3.5 QUINCKE PK (NEEDLE) ×4 IMPLANT
NS IRRIG 1000ML POUR BTL (IV SOLUTION) ×2 IMPLANT
PACK LAMINECTOMY ORTHO (CUSTOM PROCEDURE TRAY) ×2 IMPLANT
PACK UNIVERSAL I (CUSTOM PROCEDURE TRAY) ×2 IMPLANT
PAD ARMBOARD 7.5X6 YLW CONV (MISCELLANEOUS) ×5 IMPLANT
PATTIES SURGICAL .5 X.5 (GAUZE/BANDAGES/DRESSINGS) ×2 IMPLANT
PATTIES SURGICAL .5 X1 (DISPOSABLE) ×2 IMPLANT
SPONGE SURGIFOAM ABS GEL 100 (HEMOSTASIS) ×1 IMPLANT
STAPLER VISISTAT 35W (STAPLE) IMPLANT
SURGIFLO W/THROMBIN 8M KIT (HEMOSTASIS) ×1 IMPLANT
SUT BONE WAX W31G (SUTURE) ×2 IMPLANT
SUT MON AB 3-0 SH 27 (SUTURE) ×2
SUT MON AB 3-0 SH27 (SUTURE) ×1 IMPLANT
SUT VIC AB 0 CT1 27 (SUTURE)
SUT VIC AB 0 CT1 27XBRD ANBCTR (SUTURE) IMPLANT
SUT VIC AB 1 CT1 18XCR BRD 8 (SUTURE) ×1 IMPLANT
SUT VIC AB 1 CT1 8-18 (SUTURE) ×2
SUT VIC AB 2-0 CT1 18 (SUTURE) ×2 IMPLANT
SYR BULB IRRIGATION 50ML (SYRINGE) ×2 IMPLANT
SYR CONTROL 10ML LL (SYRINGE) ×2 IMPLANT
TOWEL GREEN STERILE (TOWEL DISPOSABLE) ×2 IMPLANT
TOWEL GREEN STERILE FF (TOWEL DISPOSABLE) ×2 IMPLANT
YANKAUER SUCT BULB TIP NO VENT (SUCTIONS) ×1 IMPLANT

## 2018-08-14 NOTE — Discharge Instructions (Signed)
Laminectomy, Care After This sheet gives you information about how to care for yourself after your procedure. Your health care provider may also give you more specific instructions. If you have problems or questions, contact your health care provider. What can I expect after the procedure? After the procedure, it is common to have:  Some pain around your incision area.  Muscle tightening (spasms) across the back.  Follow these instructions at home: Incision care  Follow instructions from your health care provider about how to take care of your incision area. Make sure you: ? Wash your hands with soap and water before and after you apply medicine to the area or change your bandage (dressing). If soap and water are not available, use hand sanitizer. ? Change your dressing as told by your health care provider. ? Leave stitches (sutures), skin glue, or adhesive strips in place. These skin closures may need to stay in place for 2 weeks or longer. If adhesive strip edges start to loosen and curl up, you may trim the loose edges. Do not remove adhesive strips completely unless your health care provider tells you to do that.    Check your incision area every day for signs of infection. Check for: ? More redness, swelling, or pain. ? More fluid or blood. ? Warmth. ? Pus or a bad smell. Medicines  Take over-the-counter and prescription medicines only as told by your health care provider.  If you were prescribed an antibiotic medicine, use it as told by your health care provider. Do not stop using the antibiotic even if you start to feel better. Bathing  Do not take baths, swim, or use a hot tub for 6 weeks, or until your incision has healed completely.  If your health care provider approves, you may take showers after your dressing has been removed.  Ok to shower in 5 days Activity  Return to your normal activities as told by your health care provider. Ask your health care provider what  activities are safe for you.  Avoid bending or twisting at your waist. Always bend at your knees.  Do not sit for more than 20-30 minutes at a time. Lie down or walk between periods of sitting.  Do not lift anything that is heavier than 10 lb (4.5 kg) or the limit that your health care provider tells you, until he or she says that it is safe.  Do not drive for 2 weeks after your procedure or for as long as your health care provider tells you.    Do not drive or use heavy machinery while taking prescription pain medicine. General instructions  To prevent or treat constipation while you are taking prescription pain medicine, your health care provider may recommend that you: ? Drink enough fluid to keep your urine clear or pale yellow. ? Take over-the-counter or prescription medicines. ? Eat foods that are high in fiber, such as fresh fruits and vegetables, whole grains, and beans. ? Limit foods that are high in fat and processed sugars, such as fried and sweet foods.  Do breathing exercises as told.  Keep all follow-up visits as told by your health care provider. This is important. Contact a health care provider if:  You have more redness, swelling, or pain around your incision area.  Your incision feels warm to the touch.  You are not able to return to activities or do exercises as told by your health care provider. Get help right away if:  You have: ? More  fluid or blood coming from your incision area. ? Pus or a bad smell coming from your incision area. ? Chills or a fever. ? Episodes of dizziness or fainting while standing.  You develop a rash.  You develop shortness of breath or you have difficulty breathing.  You cannot control when you urinate or have a bowel movement.  You become weak.  You are not able to use your legs. Summary  After the procedure, it is common to have some pain around your incision area. You may also have muscle tightening (spasms) across  the back.  Follow instructions from your health care provider about how to care for your incision.  Do not lift anything that is heavier than 10 lb (4.5 kg) or the limit that your health care provider tells you, until he or she says that it is safe.  Contact your health care provider if you have more redness, swelling, or pain around your incision area or if your incision feels warm to the touch. These can be signs of infection. This information is not intended to replace advice given to you by your health care provider. Make sure you discuss any questions you have with your health care provider.  Surgical Spinal Decompression, Care After Refer to this sheet in the next few weeks. These instructions provide you with information about caring for yourself after your procedure. Your health care provider may also give you more specific instructions. Your treatment has been planned according to current medical practices, but problems sometimes occur. Call your health care provider if you have any problems or questions after your procedure. What can I expect after the procedure? It is common to have pain for the first few days after the procedure. Some people continue to have mild pain even after making a full recovery. Follow these instructions at home: Medicine  Take medicines only as directed by your health care provider.  Avoid taking over-the-counter pain medicines unless your health care provider tells you otherwise. These medicines interfere with the development and growth of new bone cells.  If you were prescribed a narcotic pain medicine, take it exactly as told by your health care provider. ? Do not drink alcohol while on the medicine. ? Do not drive while on the medicine. Injury care  Care for your back brace as told by your health care provider.  If directed, apply ice to the injured area: ? Put ice in a plastic bag. ? Place a towel between your skin and the bag. ? Leave the ice  on for 20 minutes, 2-3 times a day. Activity  Perform physical therapy exercises as told by your health care provider.  Exercise regularly. Start by taking short walks. Slowly increase your activity level over time. Gentle exercise helps to ease pain.  Sit, stand, walk, turn in bed, and reposition yourself as told by your health care provider. This will help to keep your spine in proper alignment.  Avoid bending and twisting your body.  Avoid doing strenuous household chores, such as vacuuming.  Do not lift anything that is heavier than 10 lb (4.5 kg). Other Instructions  Keep all follow-up visits as directed by your health care provider. This is important.  Do not use any tobacco products, including cigarettes, chewing tobacco, or electronic cigarettes. If you need help quitting, ask your health care provider. Nicotine affects the way bones heal. Contact a health care provider if:  Your pain gets worse.  You have a fever.  You have  redness, swelling, or pain at the site of your incision.  You have fluid, blood, or pus coming from your incision.  You have numbness, tingling, or weakness in any part of your body. Get help right away if:  Your incision feels swollen and tender, and the surrounding area looks like a lump. The lump may be red or bluish in color.  You cannot move any part of your body (paralysis).  You cannot control your bladder or bowels. This information is not intended to replace advice given to you by your health care provider. Make sure you discuss any questions you have with your health care provider.

## 2018-08-14 NOTE — Transfer of Care (Signed)
Immediate Anesthesia Transfer of Care Note  Patient: Troy Wu  Procedure(s) Performed: Right L4-5 decompression/disectomy (Right Back)  Patient Location: PACU  Anesthesia Type:General  Level of Consciousness: awake, alert , oriented and sedated  Airway & Oxygen Therapy: Patient Spontanous Breathing and Patient connected to face mask oxygen  Post-op Assessment: Report given to RN, Post -op Vital signs reviewed and stable and Patient moving all extremities  Post vital signs: Reviewed and stable  Last Vitals:  Vitals Value Taken Time  BP 124/60 08/14/2018  9:37 AM  Temp    Pulse 77 08/14/2018  9:38 AM  Resp 19 08/14/2018  9:38 AM  SpO2 100 % 08/14/2018  9:38 AM  Vitals shown include unvalidated device data.  Last Pain:  Vitals:   08/14/18 0559  TempSrc:   PainSc: 0-No pain         Complications: No apparent anesthesia complications

## 2018-08-14 NOTE — Anesthesia Postprocedure Evaluation (Signed)
Anesthesia Post Note  Patient: Troy Wu  Procedure(s) Performed: Right L4-5 decompression/disectomy (Right Back)     Patient location during evaluation: PACU Anesthesia Type: General Level of consciousness: awake and alert Pain management: pain level controlled Vital Signs Assessment: post-procedure vital signs reviewed and stable Respiratory status: spontaneous breathing, nonlabored ventilation and respiratory function stable Cardiovascular status: blood pressure returned to baseline and stable Postop Assessment: no apparent nausea or vomiting Anesthetic complications: no    Last Vitals:  Vitals:   08/14/18 1030 08/14/18 1047  BP:  (!) 143/70  Pulse: 65 76  Resp: 16 18  Temp:  (!) 36.3 C  SpO2: 95% 97%    Last Pain:  Vitals:   08/14/18 1047  TempSrc: Oral  PainSc:                  Audry Pili

## 2018-08-14 NOTE — Op Note (Signed)
Operative report  Preoperative diagnosis: Right L4-5 disc herniation with L5 radiculopathy  Postoperative diagnosis: Same  Operative procedure: Right L4/5 discectomy with L4 hemilaminotomy with partial facetectomy and medial foraminotomy  Intraoperative findings: Lateral recess stenosis secondary to osteophyte from the medial aspect of the facet.  Subannular disc herniation consistent with preoperative MRI.  Removed multiple fragments of disc.  Complications: None  Anesthesia: General  Indications: This is a very pleasant 72 year old gentleman who presented to my care with significant back buttock and L5 radicular leg pain.  Imaging studies demonstrated a subtle annular disc herniation with lateral recess stenosis and compression of the L5 nerve root.  Attempted conservative care had failed to alleviate his symptoms so elected to proceed with surgery.  All appropriate risks benefits and alternatives to surgery were discussed with the patient and his family and consent was obtained.  Operative report  Patient was brought the operating room placed by the operating room table.  After successful induction of general anesthesia and endotracheally patient teds SCDs were applied and he was turned prone onto the Wilson frame.  All bony prominences were well-padded and the back was prepped and draped in a standard fashion.  Timeout was taken to confirm patient procedure and all other important data.  Once this was complete 2 needles were placed into the back and an intraoperative x-ray was taken for localization.  The skin incision was marked out and then infiltrated with quarter percent Marcaine with epinephrine.  A midline incision was made centered over the L4-5 disc space.  Sharp dissection was carried out down to the deep fascia.  The deep fascia was sharply incised and bluntly dissected to expose the lamina of L4 and L5.  A Taylor retractor was placed on the lateral aspect of the facet and I placed a  Penfield 4 under the L4 lamina.  A second x-ray was taken to confirm I was at the appropriate level.  A laminotomy was then performed with a 3 mm Kerrison Roger and I used bone wax to seal the bleeding bony edges.  A Penfield 4 was used to dissect through the ligamentum flavum and then I used a 2 and 3 mm Kerrison punch to remove the ligamentum flavum and exposed the dorsal surface of the thecal sac.  A Penfield 4 was used to gently dissect into the lateral recess creating a plane between the thecal sac and the ligamentum flavum.  Neuro patties were then used to retract the thecal sac allowing me to use my Kerrison rongeur to perform a medial facetectomy.  The osteophyte in the lateral recess from the medial aspect of the facet was resected.  There are large epidural veins all of which were coagulated with bipolar cautery.  I then continued my dissection laterally until I could palpate the medial border of the L5 pedicle.  I then swept into the foramen with my 3 mm Kerrison Roger and performed a medial foraminotomy.  At this point the L5 nerve root was clearly visible and was dorsally displacing.  I gently swept the thecal sac medially and exposed the disc herniation.  The nerve root and the thecal sac were protected with a neuro patty and then the annulus was incised.  Using a sweeping motion with a nerve hook I delivered multiple fragments of disc material and resected it with the [employer].  At this point the L5 nerve root was now completely free of tension and was easily mobile.  Bone wax was then placed on  the posterior aspect of the vertebral body that was bleeding from where the disc material had eroded into the bone.  At this point I then swept circumferentially at the level of the annulus with my [facility] elevator and then swept superiorly in the lateral recess inferiorly and out the foramen.  There was no residual neural compression or significant stenosis.  The nerve root was easily mobile and  was no longer under compression.  At this point I was pleased with the discectomy and decompression I obtained hemostasis using bipolar cautery and FloSeal.  The wound was copiously irrigated with normal saline.  I then placed approximately 1 cc of Depo-Medrol (40 mg) over the nerve root for postoperative analgesia.  A thrombin-soaked Gelfoam patty was then placed over the entire laminotomy defect and the [facility] retractor removed.  Muscle edges were coagulated to prevent excessive bleeding.  I then closed the deep fascia with interrupted #1 Vicryl sutures.  Superficial was closed with 2-0 Vicryl suture, and the skin was closed with 3-0 Monocryl.  Steri-Strips and a dry dressing were applied.  At the end of the case all needle sponge counts were correct.  There were no adverse intraoperative events.  Patient was ultimately extubated transfer the PACU without incident.

## 2018-08-14 NOTE — Anesthesia Procedure Notes (Signed)
Procedure Name: Intubation Date/Time: 08/14/2018 7:39 AM Performed by: Scheryl Darter, CRNA Pre-anesthesia Checklist: Patient identified, Emergency Drugs available, Suction available and Patient being monitored Patient Re-evaluated:Patient Re-evaluated prior to induction Oxygen Delivery Method: Circle System Utilized Preoxygenation: Pre-oxygenation with 100% oxygen Induction Type: IV induction Ventilation: Mask ventilation without difficulty Laryngoscope Size: Mac and 4 Grade View: Grade I Tube type: Oral Tube size: 7.5 mm Number of attempts: 1 Airway Equipment and Method: Stylet and Oral airway Placement Confirmation: ETT inserted through vocal cords under direct vision,  positive ETCO2 and breath sounds checked- equal and bilateral Secured at: 22 cm Tube secured with: Tape Dental Injury: Teeth and Oropharynx as per pre-operative assessment

## 2018-08-14 NOTE — Progress Notes (Signed)
Patient is discharged from room 3C08 at this time. Alert and in stable condition. IV site d/c'[d and instructions read to patient and spouse with understanding verbalized. Left unit via wheelchair with all belongings at side. 

## 2018-08-14 NOTE — Evaluation (Addendum)
Occupational Therapy Evaluation and Discharge  Patient Details Name: Troy Wu MRN: [id number] DOB: 01-Apr-1946 Today's Date: 08/14/2018    History of Present Illness Pt is a 72 y/o male s/p L4-5 decompression and disectomy. PMN includes HTN, and sleep apnea.    Clinical Impression   PTA patient independent and working.  Currently admitted for above and limited by problem list below.  Patient educated on precautions, safety, ADL compensatory techniques, brace mgmt and wear schedule and recommendations.  Patient demonstrates ability to complete all self care and mobility at supervision level after educated on techniques with precautions, has support from spouse at dc and agreeable to use 3:1 in shower as chair.  All patient education has been completed and no further needs have been identified.  Pt/ spouse have no further questions or concerns.Thank you for this referral.  OT signing off.     Follow Up Recommendations  No OT follow up    Equipment Recommendations  None recommended by OT    Recommendations for Other Services       Precautions / Restrictions Precautions Precautions: Back Precaution Booklet Issued: Yes (comment) Precaution Comments: Reviewed back precautions with pt.  Required Braces or Orthoses: Spinal Brace Spinal Brace: Lumbar corset;Applied in sitting position Restrictions Weight Bearing Restrictions: No      Mobility Bed Mobility Overal bed mobility: Needs Assistance Bed Mobility: Rolling;Sidelying to Sit Rolling: Supervision Sidelying to sit: Supervision       General bed mobility comments: OOB upon entry  Transfers Overall transfer level: Needs assistance Equipment used: None Transfers: Sit to/from Stand Sit to Stand: Supervision         General transfer comment: supervision for safety    Balance Overall balance assessment: Mild deficits observed, not formally tested                                         ADL  either performed or assessed with clinical judgement   ADL Overall ADL's : Needs assistance/impaired     Grooming: Supervision/safety;Standing Grooming Details (indicate cue type and reason): reviewed compensatory techniques for grooming and precautions  Upper Body Bathing: Set up;Sitting   Lower Body Bathing: Set up;Sitting/lateral leans;Cueing for compensatory techniques;Cueing for back precautions;Supervison/ safety Lower Body Bathing Details (indicate cue type and reason): reviewed figure 4 technique to bathe LEs, and safety completing seated Upper Body Dressing : Set up;Sitting;Supervision/safety Upper Body Dressing Details (indicate cue type and reason): supervision to don brace  Lower Body Dressing: Supervision/safety;Sit to/from stand;Cueing for compensatory techniques;Cueing for back precautions;Set up Lower Body Dressing Details (indicate cue type and reason): educated on figure 4 technique for LB dressing, supervision  Toilet Transfer: Supervision/safety;Ambulation Toilet Transfer Details (indicate cue type and reason): simulated in room, plans to use 3:1 over toilet at [place] and Hygiene: Supervision/safety;Cueing for compensatory techniques   Tub/ Shower Transfer: Walk-in shower;3 in Risk manager Details (indicate cue type and reason): reviewed safety for transfer, supporting self on wall during transfer over threshold Functional mobility during ADLs: Supervision/safety General ADL Comments: reviewed compensatory techniques, safety and body mechanics in regards to ADLs and daily activities      Vision Baseline Vision/History: Wears glasses Wears Glasses: At all times Patient Visual Report: No change from baseline Vision Assessment?: No apparent visual deficits     Perception     Praxis      Pertinent Vitals/Pain Pain  Assessment: Faces Pain Score: 4  Faces Pain Scale: Hurts a little bit Pain Location: back Pain  Descriptors / Indicators: Aching;Operative site guarding Pain Intervention(s): Monitored during session     Hand Dominance     Extremity/Trunk Assessment Upper Extremity Assessment Upper Extremity Assessment: Overall WFL for tasks assessed   Lower Extremity Assessment Lower Extremity Assessment: Defer to PT evaluation RLE Deficits / Details: Reports pain in RLE better than before surgery, however, reports he has weakness at baseline.    Cervical / Trunk Assessment Cervical / Trunk Assessment: Other exceptions Cervical / Trunk Exceptions: s/p lumbar surgery   Communication Communication Communication: No difficulties   Cognition Arousal/Alertness: Awake/alert Behavior During Therapy: WFL for tasks assessed/performed Overall Cognitive Status: Within Functional Limits for tasks assessed                                     General Comments  spouse present and supportive    Exercises     Shoulder Instructions      Home Living Family/patient expects to be discharged to:: Private residence Living Arrangements: Spouse/significant other Available Help at Discharge: Family;Available 24 hours/day Type of Home: House Home Access: Stairs to enter [employer] of Steps: 4 Entrance Stairs-Rails: Right;Left Home Layout: One level     Bathroom Shower/Tub: Occupational psychologist: Handicapped height     Home Equipment: Environmental consultant - 2 wheels;Bedside commode;Shower seat - built in          Prior Functioning/Environment Level of Independence: Independent        Comments: indepedent, driving and working part time        OT Problem List: Decreased activity tolerance;Impaired balance (sitting and/or standing);Decreased safety awareness;Decreased knowledge of use of DME or AE;Decreased knowledge of precautions;Pain      OT Treatment/Interventions:      OT Goals(Current goals can be found in the care plan section) Acute Rehab OT Goals Patient  Stated Goal: to go home today OT Goal Formulation: With patient  OT Frequency:     Barriers to D/C:            Co-evaluation              AM-PAC OT "6 Clicks" Daily Activity     Outcome Measure Help from another person eating meals?: None Help from another person taking care of personal grooming?: None Help from another person toileting, which includes using toliet, bedpan, or urinal?: None Help from another person bathing (including washing, rinsing, drying)?: None Help from another person to put on and taking off regular upper body clothing?: None Help from another person to put on and taking off regular lower body clothing?: None 6 Click Score: 24   End of Session Equipment Utilized During Treatment: Back brace Nurse Communication: Mobility status  Activity Tolerance: Patient tolerated treatment well Patient left: with call bell/phone within reach;Other (comment);with family/visitor present(seated EOB)  OT Visit Diagnosis: Pain Pain - part of body: (back)                Time: 4166-0630 OT Time Calculation (min): 12 min Charges:  OT General Charges $OT Visit: 1 Visit OT Evaluation $OT Eval Low Complexity: [place], OT Acute Rehabilitation Services Pager [phone] Office [phone]   Delight Stare 08/14/2018, 4:21 PM

## 2018-08-14 NOTE — Brief Op Note (Signed)
08/14/2018  9:35 AM  PATIENT:  Troy Wu  72 y.o. male  PRE-OPERATIVE DIAGNOSIS:  Right L5 radiculopathy, L4-5 HNP  POST-OPERATIVE DIAGNOSIS:  Right L5 radiculopathy, L4-5 HNP  PROCEDURE:  Procedure(s) with comments: Right L4-5 decompression/disectomy (Right) - 2.5 hrs  SURGEON:  Surgeon(s) and Role:    Melina Schools, MD - Primary  PHYSICIAN ASSISTANT:   ASSISTANTS: none   ANESTHESIA:   general  EBL:  300 mL   BLOOD ADMINISTERED:none  DRAINS: none   LOCAL MEDICATIONS USED:  MARCAINE,  depomedrol  SPECIMEN:  No Specimen  DISPOSITION OF SPECIMEN:  N/A  COUNTS:  YES  TOURNIQUET:  * No tourniquets in log *  DICTATION: .Dragon Dictation  PLAN OF CARE: Admit for overnight observation  PATIENT DISPOSITION:  PACU - hemodynamically stable.

## 2018-08-14 NOTE — Progress Notes (Signed)
Physical Therapy Evaluation and Discharge Patient Details Name: Troy Wu MRN: [id number] DOB: 02-26-1946 Today's Date: 08/14/2018   History of Present Illness  Pt is a 72 y/o male s/p L4-5 decompression and disectomy. PMN includes HTN, and sleep apnea.   Clinical Impression  Patient evaluated by Physical Therapy with no further acute PT needs identified. All education has been completed and the patient has no further questions. Pt overall steady with gait and stair navigation requiring min guard A for safety. Noted trendelenburg gait, but pt reports this as baseline. Reviewed back precautions and walking program. Reports wife will be able to assist as needed. See below for any follow-up Physical Therapy or equipment needs. PT is signing off. Thank you for this referral. If needs change, please re-consult.      Follow Up Recommendations No PT follow up;Supervision for mobility/OOB    Equipment Recommendations  None recommended by PT    Recommendations for Other Services       Precautions / Restrictions Precautions Precautions: Back Precaution Booklet Issued: Yes (comment) Precaution Comments: Reviewed back precautions with pt.  Required Braces or Orthoses: Spinal Brace Spinal Brace: Lumbar corset Restrictions Weight Bearing Restrictions: No      Mobility  Bed Mobility Overal bed mobility: Needs Assistance Bed Mobility: Rolling;Sidelying to Sit Rolling: Supervision Sidelying to sit: Supervision       General bed mobility comments: Supervision for safety. Cues for use of log roll technique.  Transfers Overall transfer level: Needs assistance Equipment used: None Transfers: Sit to/from Stand Sit to Stand: Min guard         General transfer comment: Min guard for safety.   Ambulation/Gait Ambulation/Gait assistance: Min guard Gait Distance (Feet): 300 Feet Assistive device: None Gait Pattern/deviations: Step-through pattern;Decreased stride  length;Trendelenburg Gait velocity: Decreased    General Gait Details: Trendelenburg gait on the R hip, however, pt reports this is baseline secondary to baseline weakness. Overall steady throughout gait with no LOB. Min guard for safety. Educated about generalized walking program to perform at home.   Stairs Stairs: Yes Stairs assistance: Min guard Stair Management: One rail Right;Step to pattern;Forwards Number of Stairs: 6 General stair comments: Overall steady stair navigation. CUes for LE sequencing using step to pattern.   Wheelchair Mobility    Modified Rankin (Stroke Patients Only)       Balance Overall balance assessment: Mild deficits observed, not formally tested                                           Pertinent Vitals/Pain Pain Assessment: 0-10 Pain Score: 4  Pain Location: back Pain Descriptors / Indicators: Aching;Operative site guarding Pain Intervention(s): Limited activity within patient's tolerance;Monitored during session;Repositioned    Home Living Family/patient expects to be discharged to:: Private residence Living Arrangements: Spouse/significant other Available Help at Discharge: Family;Available 24 hours/day Type of Home: House Home Access: Stairs to enter Entrance Stairs-Rails: Psychiatric nurse of Steps: 4 Home Layout: One level Home Equipment: Walker - 2 wheels;Bedside commode;Shower seat - built in      Prior Function Level of Independence: Independent               Journalist, newspaper        Extremity/Trunk Assessment   Upper Extremity Assessment Upper Extremity Assessment: Defer to OT evaluation    Lower Extremity Assessment Lower Extremity Assessment: RLE deficits/detail RLE Deficits /  Details: Reports pain in RLE better than before surgery, however, reports he has weakness at baseline.     Cervical / Trunk Assessment Cervical / Trunk Assessment: Other exceptions Cervical / Trunk  Exceptions: s/p lumbar surgery  Communication   Communication: No difficulties  Cognition Arousal/Alertness: Awake/alert Behavior During Therapy: WFL for tasks assessed/performed Overall Cognitive Status: Within Functional Limits for tasks assessed                                        General Comments      Exercises     Assessment/Plan    PT Assessment Patent does not need any further PT services  PT Problem List         PT Treatment Interventions      PT Goals (Current goals can be found in the Care Plan section)  Acute Rehab PT Goals Patient Stated Goal: to go home today PT Goal Formulation: With patient Time For Goal Achievement: 08/14/18 Potential to Achieve Goals: Good    Frequency     Barriers to discharge        Co-evaluation               AM-PAC PT "6 Clicks" Mobility  Outcome Measure Help needed turning from your back to your side while in a flat bed without using bedrails?: None Help needed moving from lying on your back to sitting on the side of a flat bed without using bedrails?: None Help needed moving to and from a bed to a chair (including a wheelchair)?: None Help needed standing up from a chair using your arms (e.g., wheelchair or bedside chair)?: None Help needed to walk in hospital room?: A Little Help needed climbing 3-5 steps with a railing? : A Little 6 Click Score: 22    End of Session Equipment Utilized During Treatment: Gait belt;Back brace Activity Tolerance: Patient tolerated treatment well Patient left: in bed;with call bell/phone within reach(sitting EOB ) Nurse Communication: Mobility status PT Visit Diagnosis: Other abnormalities of gait and mobility (R26.89);Pain Pain - part of body: (back)    Time: 2060-1561 PT Time Calculation (min) (ACUTE ONLY): 14 min   Charges:   PT Evaluation $PT Eval Low Complexity: [place], PT, DPT  Acute Rehabilitation Services  Pager:  [phone] Office: [phone]   Rudean Hitt 08/14/2018, 12:53 PM

## 2018-08-14 NOTE — H&P (Signed)
History: Troy Wu is a very pleasant 72 year old gentleman who presents today for his preoperative H&P. Patient states that over the last 2-1/2 months he has had significant weakness in the right foot which makes it difficult to work. He cannot climb ladders and is having difficulty walking up steps. He states that the severe debilitating pain in the right leg has gotten better, but the functional weakness is significantly adversely effecting his quality-of-life. imaging studies confirmed the right L4-5 disc herniation with compression of the traversing L5 nerve root. At this point time he presents To moveforward with surgery.  Allergies NKDA   Medications amLODIPine 5 mg tablet amoxicillin 500 mg capsule ibuprofen 600 mg tablet omeprazole 20 mg capsule,delayed release spironolactone 25 mg tablet spironolactone 50 mg tablet telmisartan 80 mg tablet 1 tablet(s) every day.  Problems Reviewed Problems No known problems Family History Reviewed Family History Social History Reviewed Social History Tobacco Smoking Status: Former smoker Alcohol intake: None Review of Systems  ROS   CONSTITUTIONAL: no Fever, no Chills, no Night Sweats, no Weight Loss  CARDIOVASCULAR: no Cough, no Shortness of Breath, no COPD, no Asthma  GASTROINTESTINAL: no Vomiting, no Nausea  MUSCULOSKELETAL: no Joint Pain, no Swelling in Joints  NEUROLOGIC: NUMBNESS, TINGLING/PARESTHESIAS, DIFFICULTY WITH BALANCE  Physical Exam:  Clinical exam: Patient is alert and oriented 3. No shortness of breath, chest pain. Abdomen: Soft and nontender, no loss of bowel and bladder control, no rebound tenderness. Lumbar spine: Mild to moderate back pain with palpation or range of motion. No significant change from his baseline level of back discomfort. No significant hip, knee, ankle pain with isolated joint range of motion. Neuro: 4 out of 5 right EHL and tibialis anterior strength (positive foot drop). The remainder of  his exam is 5 out of 5. Positive straight leg raise test on the right side with reproduction of L5 radicular pain. Positive dysesthesias in the L5 dermatome. Symmetrical 1+ deep tendon reflexes at the knee and Achilles. No clonus. Reflexes: Babinski: Negative Hoffman: Negative  Lungs: Clear to auscultation bilaterally  Cardiac: No rubs gallops or murmurs. Normal rate and rhythm.  Vascular: Lower extremity peripheral pulses are 1+ dorsalis pedis/posterior tibialis pulses. Compartments are soft and nontender  Imaging studies: MRI of the lumbar spine from 05/08/18: Patient has degenerative disc disease at L5-S1. Mild anterolisthesis at L4-5 but there is a right paracentral disc protrusion with an extruded fragment causing L5 nerve compression. Mild spinal stenosis at L3-4.  Assessment/Plan: Diagnosis: Troy Wu is a very pleasant very active young man who has been having significant increased right leg pain and numbness and dysesthesias as well as weakness for about 2-1/2 months now. He has always had back pain but has been able to deal with that. He states that the recent L5 selective nerve root block provided significant improvement in the radicular pain in the right leg that he was having but unfortunately it has not helped his weakness. Treatment plan: His clinical exam is consistent with L5 radicular weakness and sensory changes. At this point given the fact that it is affecting his quality-of-life he would like to move forward with surgery. I think the best course of action is a right-sided hemilaminotomy and decompression/discectomy at L4-5. This would decompress the L5 nerve root giving it the best chance of overall recovery. We have gone over the procedure as well as the risks and benefits and all of his questions were addressed.  Risks and benefits of surgery were discussed with the patient. These include: Infection,  bleeding, death, stroke, paralysis, ongoing or worse pain, need for additional  surgery, leak of spinal fluid, adjacent segment degeneration requiring additional surgery, post-operative hematoma formation that can result in neurological compromise and the need for urgent/emergent re-operation. Loss in bowel and bladder control. Injury to major vessels that could result in the need for urgent abdominal surgery to stop bleeding. Risk of deep venous thrombosis (DVT) and the need for additional treatment. Recurrent disc herniation resulting in the need for revision surgery, which could include fusion surgery.

## 2018-08-15 ENCOUNTER — Encounter (HOSPITAL_COMMUNITY): Payer: Self-pay | Admitting: Orthopedic Surgery

## 2018-08-16 MED FILL — Thrombin (Recombinant) For Soln 20000 Unit: CUTANEOUS | Qty: 1 | Status: AC

## 2018-08-22 DIAGNOSIS — Z8249 Family history of ischemic heart disease and other diseases of the circulatory system: Secondary | ICD-10-CM | POA: Diagnosis not present

## 2018-08-22 DIAGNOSIS — J309 Allergic rhinitis, unspecified: Secondary | ICD-10-CM | POA: Diagnosis not present

## 2018-08-22 DIAGNOSIS — H409 Unspecified glaucoma: Secondary | ICD-10-CM | POA: Diagnosis not present

## 2018-08-22 DIAGNOSIS — N400 Enlarged prostate without lower urinary tract symptoms: Secondary | ICD-10-CM | POA: Diagnosis not present

## 2018-08-22 DIAGNOSIS — K219 Gastro-esophageal reflux disease without esophagitis: Secondary | ICD-10-CM | POA: Diagnosis not present

## 2018-08-22 DIAGNOSIS — I10 Essential (primary) hypertension: Secondary | ICD-10-CM | POA: Diagnosis not present

## 2018-08-22 DIAGNOSIS — Z809 Family history of malignant neoplasm, unspecified: Secondary | ICD-10-CM | POA: Diagnosis not present

## 2018-08-22 DIAGNOSIS — Z87891 Personal history of nicotine dependence: Secondary | ICD-10-CM | POA: Diagnosis not present

## 2018-08-26 NOTE — Discharge Summary (Signed)
Patient ID: Troy Wu MRN: [id number] DOB/AGE: 1946/09/04 72 y.o.  Admit date: 08/14/2018 Discharge date: 08/14/18  Admission Diagnoses:  Active Problems:   Lumbar disc herniation   Discharge Diagnoses:  Active Problems:   Lumbar disc herniation  status post Procedure(s): Right L4-5 decompression/disectomy  Past Medical History:  Diagnosis Date  . Arthritis   . Detached retina, left    x2  . GERD (gastroesophageal reflux disease)   . Hard of hearing    wears bilateral hearing aids  . Hypertension   . PONV (postoperative nausea and vomiting)   . Prostate cancer ([place])   . Sleep apnea    does not use CPAP    Surgeries: Procedure(s): Right L4-5 decompression/disectomy on 08/14/2018   Consultants:   Discharged Condition: Improved  Hospital Course: Troy Wu is an 72 y.o. male who was admitted 08/14/2018 for operative treatment of L4/5 HNP with right radiculopathy . Patient failed conservative treatments (please see the history and physical for the specifics) and had severe unremitting pain that affects sleep, daily activities and work/hobbies. After pre-op clearance, the patient was taken to the operating room on 08/14/2018 and underwent  Procedure(s): Right L4-5 decompression/disectomy.   Patient doing well - discharged same day as surgery  Patient was given perioperative antibiotics:  Anti-infectives (From admission, onward)   Start     Dose/Rate Route Frequency Ordered Stop   08/14/18 1500  ceFAZolin (ANCEF) IVPB 1 g/50 mL premix  Status:  Discontinued     1 g 100 mL/hr over 30 Minutes Intravenous Every 8 hours 08/14/18 1050 08/14/18 2000   08/14/18 0547  ceFAZolin (ANCEF) IVPB 2g/100 mL premix     2 g 200 mL/hr over 30 Minutes Intravenous 30 min pre-op 08/14/18 0547 08/14/18 0742       Patient was given sequential compression devices and early ambulation to prevent DVT.   Patient benefited maximally from hospital stay and there were no  complications. At the time of discharge, the patient was urinating/moving their bowels without difficulty, tolerating a regular diet, pain is controlled with oral pain medications and they have been cleared by PT/OT.   Recent vital signs: No data found.   Recent laboratory studies: No results for input(s): WBC, HGB, HCT, PLT, NA, K, CL, CO2, BUN, CREATININE, GLUCOSE, INR, CALCIUM in the last 72 hours.  Invalid input(s): PT, 2   Discharge Medications:   Allergies as of 08/14/2018      Reactions   Codeine Nausea Only   Sick on stomach      Medication List    TAKE these medications   amLODipine 5 MG tablet Commonly known as:  NORVASC Take 5 mg by mouth daily.   KLS ALLER-TEC 10 MG tablet Generic drug:  cetirizine Take 10 mg by mouth daily.   latanoprost 0.005 % ophthalmic solution Commonly known as:  XALATAN Place 1 drop into both eyes at bedtime.   omeprazole 20 MG capsule Commonly known as:  PRILOSEC Take 20 mg by mouth daily.   spironolactone 50 MG tablet Commonly known as:  ALDACTONE Take 50 mg by mouth daily.   tamsulosin 0.4 MG Caps capsule Commonly known as:  FLOMAX Take 0.4 mg by mouth daily.   telmisartan 80 MG tablet Commonly known as:  MICARDIS Take 80 mg by mouth daily.   timolol 0.25 % ophthalmic solution Commonly known as:  BETIMOL Place 1 drop into both eyes daily.       Diagnostic Studies: Dg Lumbar Spine 2-3  Views  Result Date: 08/14/2018 CLINICAL DATA:  Lateral intraoperative localization radiographs prior to L4-5 decompression. EXAM: LUMBAR SPINE - 2-3 VIEW COMPARISON:  Lumbar spine series of August 30, 2015 and MRI of the lumbar spine of May 08, 2018. FINDINGS: The initial image reveals a metallic needle projecting 2.7 cm posterior to the L3-4 disc space. The lower needle projects 2.1 cm posterior to the L4-5 disc space. The second image timed at 8:09 a.m. reveals a metallic trocar projecting 2.6 cm posterior to the L4-5 disc. IMPRESSION:  Localization radiographs with positioning of the localization needles and probes as described. The findings were called to the operating room and the report given to [facility] at 8:41 a.m. on 14 August 2018. Electronically Signed   By: David  [place] M.D.   On: 08/14/2018 08:36    Discharge Instructions    Incentive spirometry RT   Complete by:  As directed       Follow-up [place], Myrth Dahan, MD In 2 weeks.   Specialty:  Orthopedic Surgery Why:  If symptoms worsen, For suture removal, For wound re-check Contact information: [address] [place] [address] [phone]           Discharge Plan:  discharge to home  Disposition: home    Signed: Dahlia Bailiff for Dr. Melina Schools [facility] [phone] 08/26/2018, 5:26 PM

## 2018-09-02 DIAGNOSIS — I10 Essential (primary) hypertension: Secondary | ICD-10-CM | POA: Diagnosis not present

## 2018-09-02 DIAGNOSIS — Z6828 Body mass index (BMI) 28.0-28.9, adult: Secondary | ICD-10-CM | POA: Diagnosis not present

## 2018-09-02 DIAGNOSIS — R7301 Impaired fasting glucose: Secondary | ICD-10-CM | POA: Diagnosis not present

## 2018-09-02 DIAGNOSIS — Z79899 Other long term (current) drug therapy: Secondary | ICD-10-CM | POA: Diagnosis not present

## 2018-09-05 ENCOUNTER — Encounter (HOSPITAL_COMMUNITY): Payer: Medicare HMO

## 2018-09-05 DIAGNOSIS — R600 Localized edema: Secondary | ICD-10-CM | POA: Diagnosis not present

## 2018-09-05 DIAGNOSIS — M79662 Pain in left lower leg: Secondary | ICD-10-CM | POA: Diagnosis not present

## 2018-09-05 DIAGNOSIS — M79661 Pain in right lower leg: Secondary | ICD-10-CM | POA: Diagnosis not present

## 2018-09-23 DIAGNOSIS — E785 Hyperlipidemia, unspecified: Secondary | ICD-10-CM | POA: Diagnosis not present

## 2018-09-30 DIAGNOSIS — M7989 Other specified soft tissue disorders: Secondary | ICD-10-CM | POA: Diagnosis not present

## 2018-09-30 DIAGNOSIS — I10 Essential (primary) hypertension: Secondary | ICD-10-CM | POA: Diagnosis not present

## 2018-09-30 DIAGNOSIS — E1169 Type 2 diabetes mellitus with other specified complication: Secondary | ICD-10-CM | POA: Diagnosis not present

## 2018-09-30 DIAGNOSIS — Z6829 Body mass index (BMI) 29.0-29.9, adult: Secondary | ICD-10-CM | POA: Diagnosis not present

## 2018-09-30 DIAGNOSIS — M545 Low back pain, unspecified: Secondary | ICD-10-CM | POA: Insufficient documentation

## 2018-09-30 DIAGNOSIS — E663 Overweight: Secondary | ICD-10-CM | POA: Diagnosis not present

## 2018-10-01 DIAGNOSIS — H5201 Hypermetropia, right eye: Secondary | ICD-10-CM | POA: Diagnosis not present

## 2018-10-02 DIAGNOSIS — M545 Low back pain: Secondary | ICD-10-CM | POA: Diagnosis not present

## 2018-10-02 DIAGNOSIS — M6281 Muscle weakness (generalized): Secondary | ICD-10-CM | POA: Diagnosis not present

## 2018-10-09 DIAGNOSIS — M6281 Muscle weakness (generalized): Secondary | ICD-10-CM | POA: Diagnosis not present

## 2018-10-09 DIAGNOSIS — M545 Low back pain: Secondary | ICD-10-CM | POA: Diagnosis not present

## 2018-10-21 DIAGNOSIS — I10 Essential (primary) hypertension: Secondary | ICD-10-CM | POA: Diagnosis not present

## 2018-10-21 DIAGNOSIS — Z6828 Body mass index (BMI) 28.0-28.9, adult: Secondary | ICD-10-CM | POA: Diagnosis not present

## 2018-10-23 DIAGNOSIS — M545 Low back pain: Secondary | ICD-10-CM | POA: Diagnosis not present

## 2018-10-23 DIAGNOSIS — M6281 Muscle weakness (generalized): Secondary | ICD-10-CM | POA: Diagnosis not present

## 2018-10-30 DIAGNOSIS — M6281 Muscle weakness (generalized): Secondary | ICD-10-CM | POA: Diagnosis not present

## 2018-10-30 DIAGNOSIS — M545 Low back pain: Secondary | ICD-10-CM | POA: Diagnosis not present

## 2018-11-05 DIAGNOSIS — H40003 Preglaucoma, unspecified, bilateral: Secondary | ICD-10-CM | POA: Diagnosis not present

## 2018-11-05 DIAGNOSIS — H33002 Unspecified retinal detachment with retinal break, left eye: Secondary | ICD-10-CM | POA: Diagnosis not present

## 2018-11-06 DIAGNOSIS — M545 Low back pain: Secondary | ICD-10-CM | POA: Diagnosis not present

## 2018-11-06 DIAGNOSIS — M6281 Muscle weakness (generalized): Secondary | ICD-10-CM | POA: Diagnosis not present

## 2018-11-13 DIAGNOSIS — M545 Low back pain: Secondary | ICD-10-CM | POA: Diagnosis not present

## 2018-11-13 DIAGNOSIS — M6281 Muscle weakness (generalized): Secondary | ICD-10-CM | POA: Diagnosis not present

## 2019-03-06 DIAGNOSIS — H33302 Unspecified retinal break, left eye: Secondary | ICD-10-CM | POA: Diagnosis not present

## 2019-03-06 DIAGNOSIS — H401131 Primary open-angle glaucoma, bilateral, mild stage: Secondary | ICD-10-CM | POA: Diagnosis not present

## 2019-03-18 DIAGNOSIS — M25562 Pain in left knee: Secondary | ICD-10-CM | POA: Diagnosis not present

## 2019-03-24 DIAGNOSIS — M25562 Pain in left knee: Secondary | ICD-10-CM | POA: Insufficient documentation

## 2019-03-25 DIAGNOSIS — M25562 Pain in left knee: Secondary | ICD-10-CM | POA: Diagnosis not present

## 2019-04-16 DIAGNOSIS — Z79899 Other long term (current) drug therapy: Secondary | ICD-10-CM | POA: Diagnosis not present

## 2019-04-16 DIAGNOSIS — E1169 Type 2 diabetes mellitus with other specified complication: Secondary | ICD-10-CM | POA: Diagnosis not present

## 2019-04-16 DIAGNOSIS — E785 Hyperlipidemia, unspecified: Secondary | ICD-10-CM | POA: Diagnosis not present

## 2019-04-22 DIAGNOSIS — E785 Hyperlipidemia, unspecified: Secondary | ICD-10-CM | POA: Diagnosis not present

## 2019-04-22 DIAGNOSIS — Z6826 Body mass index (BMI) 26.0-26.9, adult: Secondary | ICD-10-CM | POA: Diagnosis not present

## 2019-04-22 DIAGNOSIS — I10 Essential (primary) hypertension: Secondary | ICD-10-CM | POA: Diagnosis not present

## 2019-04-22 DIAGNOSIS — K219 Gastro-esophageal reflux disease without esophagitis: Secondary | ICD-10-CM | POA: Diagnosis not present

## 2019-04-22 DIAGNOSIS — R7303 Prediabetes: Secondary | ICD-10-CM | POA: Diagnosis not present

## 2019-05-05 DIAGNOSIS — R0782 Intercostal pain: Secondary | ICD-10-CM | POA: Diagnosis not present

## 2019-07-27 IMAGING — CR DG LUMBAR SPINE 2-3V
3 series · 3 of 3 positions shown · non-contrast
Comparison: Lumbar spine series of August 30, 2015 and MRI of the
lumbar spine May 08, 2018.

CLINICAL DATA: Lateral intraoperative localization radiographs
prior to L4-5 decompression.

EXAM:
LUMBAR SPINE - 2-3 VIEW

[lateral (1 of 3)]
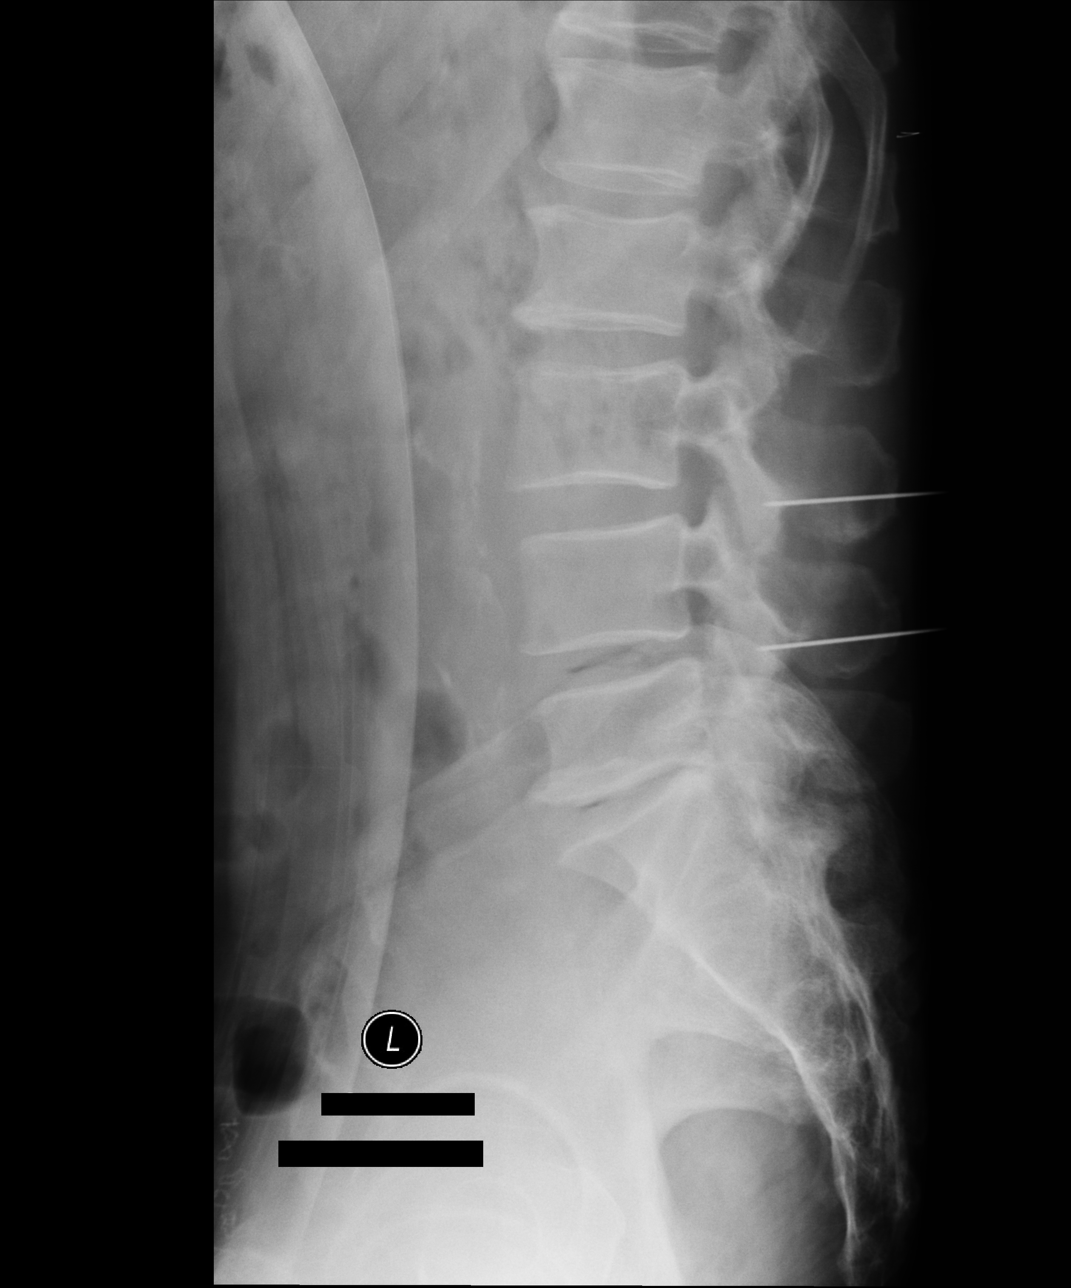

[lateral (2 of 3)]
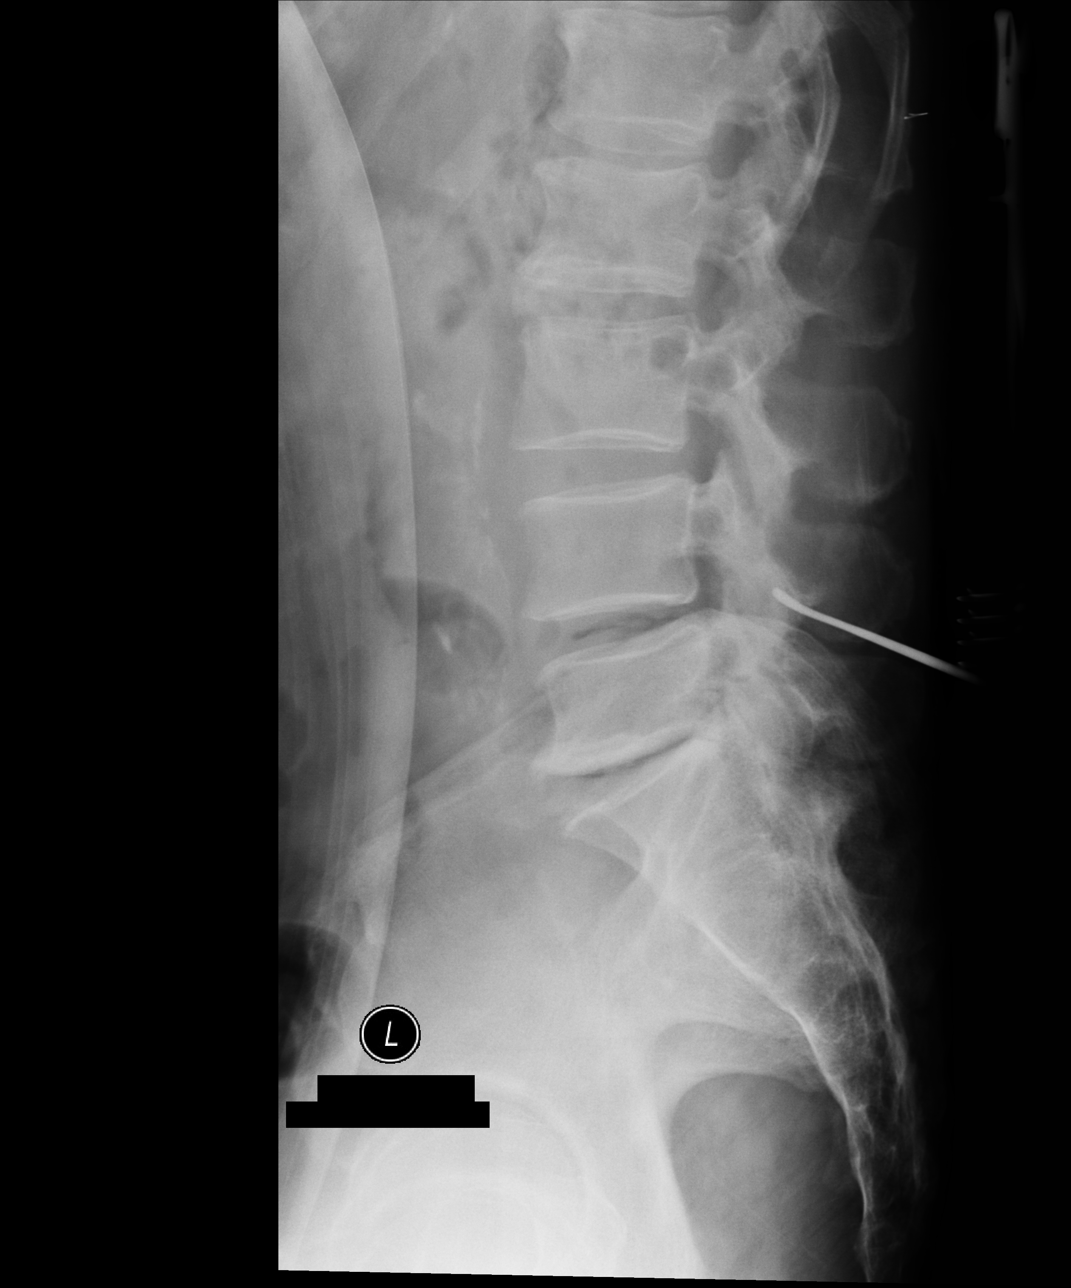

[lateral (3 of 3)]
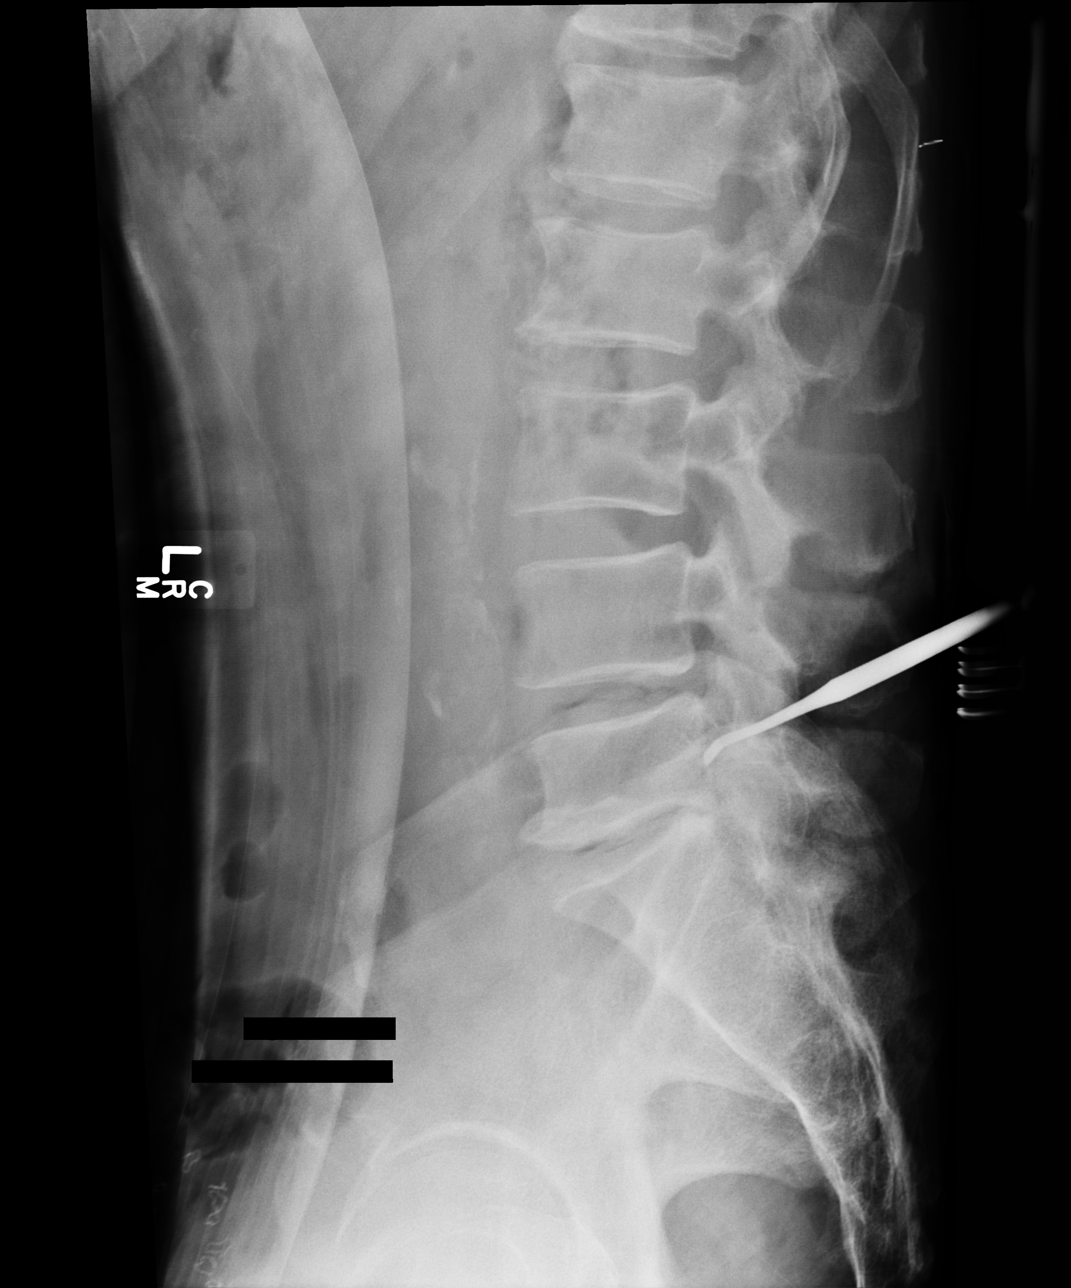

[3 of 3 positions shown; findings below may reference images not displayed]

FINDINGS: The initial image reveals a metallic needle projecting 2.7 cm
posterior to the L3-4 disc space. The lower needle projects 2.1 cm
posterior to the L4-5 disc space.

The second image timed at [DATE] a.m. reveals a metallic trocar
projecting 2.6 cm posterior to the L4-5 disc.
IMPRESSION: Localization radiographs with positioning of the localization
needles and probes as described. The findings were called to the
operating room and the report given to [place] at [DATE] a.m. on 14 August, 2018.

## 2019-08-11 DIAGNOSIS — Z6826 Body mass index (BMI) 26.0-26.9, adult: Secondary | ICD-10-CM | POA: Diagnosis not present

## 2019-08-11 DIAGNOSIS — Z0000 Encounter for general adult medical examination without abnormal findings: Secondary | ICD-10-CM | POA: Diagnosis not present

## 2019-08-11 DIAGNOSIS — Z1339 Encounter for screening examination for other mental health and behavioral disorders: Secondary | ICD-10-CM | POA: Diagnosis not present

## 2019-08-11 DIAGNOSIS — Z1331 Encounter for screening for depression: Secondary | ICD-10-CM | POA: Diagnosis not present

## 2019-08-11 DIAGNOSIS — Z7189 Other specified counseling: Secondary | ICD-10-CM | POA: Diagnosis not present

## 2019-08-11 DIAGNOSIS — E663 Overweight: Secondary | ICD-10-CM | POA: Diagnosis not present

## 2019-08-11 DIAGNOSIS — Z79899 Other long term (current) drug therapy: Secondary | ICD-10-CM | POA: Diagnosis not present

## 2019-08-11 DIAGNOSIS — E1169 Type 2 diabetes mellitus with other specified complication: Secondary | ICD-10-CM | POA: Diagnosis not present

## 2019-08-11 DIAGNOSIS — I6523 Occlusion and stenosis of bilateral carotid arteries: Secondary | ICD-10-CM | POA: Diagnosis not present

## 2019-08-11 DIAGNOSIS — C61 Malignant neoplasm of prostate: Secondary | ICD-10-CM | POA: Diagnosis not present

## 2019-08-13 ENCOUNTER — Other Ambulatory Visit (HOSPITAL_COMMUNITY): Payer: Self-pay | Admitting: Orthopedic Surgery

## 2019-08-29 DIAGNOSIS — D649 Anemia, unspecified: Secondary | ICD-10-CM | POA: Diagnosis not present

## 2019-09-22 DIAGNOSIS — M5136 Other intervertebral disc degeneration, lumbar region: Secondary | ICD-10-CM | POA: Diagnosis not present

## 2019-09-22 DIAGNOSIS — M5416 Radiculopathy, lumbar region: Secondary | ICD-10-CM | POA: Diagnosis not present

## 2019-09-22 DIAGNOSIS — M545 Low back pain: Secondary | ICD-10-CM | POA: Diagnosis not present

## 2019-09-22 DIAGNOSIS — Z9889 Other specified postprocedural states: Secondary | ICD-10-CM | POA: Diagnosis not present

## 2019-09-26 DIAGNOSIS — M545 Low back pain: Secondary | ICD-10-CM | POA: Diagnosis not present

## 2019-10-06 DIAGNOSIS — M51369 Other intervertebral disc degeneration, lumbar region without mention of lumbar back pain or lower extremity pain: Secondary | ICD-10-CM | POA: Insufficient documentation

## 2019-10-06 DIAGNOSIS — M545 Low back pain: Secondary | ICD-10-CM | POA: Diagnosis not present

## 2019-10-06 DIAGNOSIS — M5136 Other intervertebral disc degeneration, lumbar region: Secondary | ICD-10-CM | POA: Diagnosis not present

## 2019-10-06 DIAGNOSIS — M5416 Radiculopathy, lumbar region: Secondary | ICD-10-CM | POA: Diagnosis not present

## 2019-10-09 DIAGNOSIS — M5442 Lumbago with sciatica, left side: Secondary | ICD-10-CM | POA: Diagnosis not present

## 2019-10-09 DIAGNOSIS — M5136 Other intervertebral disc degeneration, lumbar region: Secondary | ICD-10-CM | POA: Diagnosis not present

## 2019-10-09 DIAGNOSIS — M9903 Segmental and somatic dysfunction of lumbar region: Secondary | ICD-10-CM | POA: Diagnosis not present

## 2019-10-09 DIAGNOSIS — M62830 Muscle spasm of back: Secondary | ICD-10-CM | POA: Diagnosis not present

## 2019-10-09 DIAGNOSIS — M9904 Segmental and somatic dysfunction of sacral region: Secondary | ICD-10-CM | POA: Diagnosis not present

## 2019-10-09 DIAGNOSIS — M5137 Other intervertebral disc degeneration, lumbosacral region: Secondary | ICD-10-CM | POA: Diagnosis not present

## 2019-10-10 DIAGNOSIS — M5137 Other intervertebral disc degeneration, lumbosacral region: Secondary | ICD-10-CM | POA: Diagnosis not present

## 2019-10-10 DIAGNOSIS — M9903 Segmental and somatic dysfunction of lumbar region: Secondary | ICD-10-CM | POA: Diagnosis not present

## 2019-10-10 DIAGNOSIS — M9904 Segmental and somatic dysfunction of sacral region: Secondary | ICD-10-CM | POA: Diagnosis not present

## 2019-10-10 DIAGNOSIS — M5442 Lumbago with sciatica, left side: Secondary | ICD-10-CM | POA: Diagnosis not present

## 2019-10-10 DIAGNOSIS — M5136 Other intervertebral disc degeneration, lumbar region: Secondary | ICD-10-CM | POA: Diagnosis not present

## 2019-10-10 DIAGNOSIS — M62830 Muscle spasm of back: Secondary | ICD-10-CM | POA: Diagnosis not present

## 2019-10-15 DIAGNOSIS — M62830 Muscle spasm of back: Secondary | ICD-10-CM | POA: Diagnosis not present

## 2019-10-15 DIAGNOSIS — M9903 Segmental and somatic dysfunction of lumbar region: Secondary | ICD-10-CM | POA: Diagnosis not present

## 2019-10-15 DIAGNOSIS — M5136 Other intervertebral disc degeneration, lumbar region: Secondary | ICD-10-CM | POA: Diagnosis not present

## 2019-10-15 DIAGNOSIS — M5442 Lumbago with sciatica, left side: Secondary | ICD-10-CM | POA: Diagnosis not present

## 2019-10-15 DIAGNOSIS — M5137 Other intervertebral disc degeneration, lumbosacral region: Secondary | ICD-10-CM | POA: Diagnosis not present

## 2019-10-15 DIAGNOSIS — M9904 Segmental and somatic dysfunction of sacral region: Secondary | ICD-10-CM | POA: Diagnosis not present

## 2019-10-17 DIAGNOSIS — M9903 Segmental and somatic dysfunction of lumbar region: Secondary | ICD-10-CM | POA: Diagnosis not present

## 2019-10-17 DIAGNOSIS — M5137 Other intervertebral disc degeneration, lumbosacral region: Secondary | ICD-10-CM | POA: Diagnosis not present

## 2019-10-17 DIAGNOSIS — M62830 Muscle spasm of back: Secondary | ICD-10-CM | POA: Diagnosis not present

## 2019-10-17 DIAGNOSIS — M5442 Lumbago with sciatica, left side: Secondary | ICD-10-CM | POA: Diagnosis not present

## 2019-10-17 DIAGNOSIS — M5136 Other intervertebral disc degeneration, lumbar region: Secondary | ICD-10-CM | POA: Diagnosis not present

## 2019-10-17 DIAGNOSIS — M9904 Segmental and somatic dysfunction of sacral region: Secondary | ICD-10-CM | POA: Diagnosis not present

## 2019-10-20 DIAGNOSIS — M5137 Other intervertebral disc degeneration, lumbosacral region: Secondary | ICD-10-CM | POA: Diagnosis not present

## 2019-10-20 DIAGNOSIS — M5136 Other intervertebral disc degeneration, lumbar region: Secondary | ICD-10-CM | POA: Diagnosis not present

## 2019-10-20 DIAGNOSIS — M9903 Segmental and somatic dysfunction of lumbar region: Secondary | ICD-10-CM | POA: Diagnosis not present

## 2019-10-20 DIAGNOSIS — M9904 Segmental and somatic dysfunction of sacral region: Secondary | ICD-10-CM | POA: Diagnosis not present

## 2019-10-20 DIAGNOSIS — M62830 Muscle spasm of back: Secondary | ICD-10-CM | POA: Diagnosis not present

## 2019-10-20 DIAGNOSIS — M5442 Lumbago with sciatica, left side: Secondary | ICD-10-CM | POA: Diagnosis not present

## 2019-10-22 DIAGNOSIS — M5137 Other intervertebral disc degeneration, lumbosacral region: Secondary | ICD-10-CM | POA: Diagnosis not present

## 2019-10-22 DIAGNOSIS — M62830 Muscle spasm of back: Secondary | ICD-10-CM | POA: Diagnosis not present

## 2019-10-22 DIAGNOSIS — M9904 Segmental and somatic dysfunction of sacral region: Secondary | ICD-10-CM | POA: Diagnosis not present

## 2019-10-22 DIAGNOSIS — M5442 Lumbago with sciatica, left side: Secondary | ICD-10-CM | POA: Diagnosis not present

## 2019-10-22 DIAGNOSIS — M5136 Other intervertebral disc degeneration, lumbar region: Secondary | ICD-10-CM | POA: Diagnosis not present

## 2019-10-22 DIAGNOSIS — M9903 Segmental and somatic dysfunction of lumbar region: Secondary | ICD-10-CM | POA: Diagnosis not present

## 2019-10-23 DIAGNOSIS — M4726 Other spondylosis with radiculopathy, lumbar region: Secondary | ICD-10-CM | POA: Diagnosis not present

## 2019-10-23 DIAGNOSIS — I10 Essential (primary) hypertension: Secondary | ICD-10-CM | POA: Diagnosis not present

## 2019-10-23 DIAGNOSIS — Z1331 Encounter for screening for depression: Secondary | ICD-10-CM | POA: Diagnosis not present

## 2019-10-23 DIAGNOSIS — Z79899 Other long term (current) drug therapy: Secondary | ICD-10-CM | POA: Diagnosis not present

## 2019-10-23 DIAGNOSIS — Z6827 Body mass index (BMI) 27.0-27.9, adult: Secondary | ICD-10-CM | POA: Diagnosis not present

## 2019-10-24 DIAGNOSIS — M5136 Other intervertebral disc degeneration, lumbar region: Secondary | ICD-10-CM | POA: Diagnosis not present

## 2019-10-24 DIAGNOSIS — M9904 Segmental and somatic dysfunction of sacral region: Secondary | ICD-10-CM | POA: Diagnosis not present

## 2019-10-24 DIAGNOSIS — M5137 Other intervertebral disc degeneration, lumbosacral region: Secondary | ICD-10-CM | POA: Diagnosis not present

## 2019-10-24 DIAGNOSIS — M5442 Lumbago with sciatica, left side: Secondary | ICD-10-CM | POA: Diagnosis not present

## 2019-10-24 DIAGNOSIS — M9903 Segmental and somatic dysfunction of lumbar region: Secondary | ICD-10-CM | POA: Diagnosis not present

## 2019-10-24 DIAGNOSIS — M62830 Muscle spasm of back: Secondary | ICD-10-CM | POA: Diagnosis not present

## 2019-10-27 DIAGNOSIS — M9903 Segmental and somatic dysfunction of lumbar region: Secondary | ICD-10-CM | POA: Diagnosis not present

## 2019-10-27 DIAGNOSIS — M62830 Muscle spasm of back: Secondary | ICD-10-CM | POA: Diagnosis not present

## 2019-10-27 DIAGNOSIS — M9904 Segmental and somatic dysfunction of sacral region: Secondary | ICD-10-CM | POA: Diagnosis not present

## 2019-10-27 DIAGNOSIS — M5442 Lumbago with sciatica, left side: Secondary | ICD-10-CM | POA: Diagnosis not present

## 2019-10-27 DIAGNOSIS — M5136 Other intervertebral disc degeneration, lumbar region: Secondary | ICD-10-CM | POA: Diagnosis not present

## 2019-10-27 DIAGNOSIS — M5137 Other intervertebral disc degeneration, lumbosacral region: Secondary | ICD-10-CM | POA: Diagnosis not present

## 2019-10-29 DIAGNOSIS — M5136 Other intervertebral disc degeneration, lumbar region: Secondary | ICD-10-CM | POA: Diagnosis not present

## 2019-10-29 DIAGNOSIS — M5442 Lumbago with sciatica, left side: Secondary | ICD-10-CM | POA: Diagnosis not present

## 2019-10-29 DIAGNOSIS — M9904 Segmental and somatic dysfunction of sacral region: Secondary | ICD-10-CM | POA: Diagnosis not present

## 2019-10-29 DIAGNOSIS — M62830 Muscle spasm of back: Secondary | ICD-10-CM | POA: Diagnosis not present

## 2019-10-29 DIAGNOSIS — M5137 Other intervertebral disc degeneration, lumbosacral region: Secondary | ICD-10-CM | POA: Diagnosis not present

## 2019-10-29 DIAGNOSIS — M9903 Segmental and somatic dysfunction of lumbar region: Secondary | ICD-10-CM | POA: Diagnosis not present

## 2019-10-31 DIAGNOSIS — M5442 Lumbago with sciatica, left side: Secondary | ICD-10-CM | POA: Diagnosis not present

## 2019-10-31 DIAGNOSIS — M62830 Muscle spasm of back: Secondary | ICD-10-CM | POA: Diagnosis not present

## 2019-10-31 DIAGNOSIS — M5136 Other intervertebral disc degeneration, lumbar region: Secondary | ICD-10-CM | POA: Diagnosis not present

## 2019-10-31 DIAGNOSIS — M9903 Segmental and somatic dysfunction of lumbar region: Secondary | ICD-10-CM | POA: Diagnosis not present

## 2019-10-31 DIAGNOSIS — M5137 Other intervertebral disc degeneration, lumbosacral region: Secondary | ICD-10-CM | POA: Diagnosis not present

## 2019-10-31 DIAGNOSIS — M9904 Segmental and somatic dysfunction of sacral region: Secondary | ICD-10-CM | POA: Diagnosis not present

## 2019-11-03 DIAGNOSIS — M9903 Segmental and somatic dysfunction of lumbar region: Secondary | ICD-10-CM | POA: Diagnosis not present

## 2019-11-03 DIAGNOSIS — M62830 Muscle spasm of back: Secondary | ICD-10-CM | POA: Diagnosis not present

## 2019-11-03 DIAGNOSIS — M5442 Lumbago with sciatica, left side: Secondary | ICD-10-CM | POA: Diagnosis not present

## 2019-11-03 DIAGNOSIS — M9904 Segmental and somatic dysfunction of sacral region: Secondary | ICD-10-CM | POA: Diagnosis not present

## 2019-11-03 DIAGNOSIS — M5137 Other intervertebral disc degeneration, lumbosacral region: Secondary | ICD-10-CM | POA: Diagnosis not present

## 2019-11-03 DIAGNOSIS — M5136 Other intervertebral disc degeneration, lumbar region: Secondary | ICD-10-CM | POA: Diagnosis not present

## 2019-11-05 DIAGNOSIS — M5137 Other intervertebral disc degeneration, lumbosacral region: Secondary | ICD-10-CM | POA: Diagnosis not present

## 2019-11-05 DIAGNOSIS — M5136 Other intervertebral disc degeneration, lumbar region: Secondary | ICD-10-CM | POA: Diagnosis not present

## 2019-11-05 DIAGNOSIS — M5442 Lumbago with sciatica, left side: Secondary | ICD-10-CM | POA: Diagnosis not present

## 2019-11-05 DIAGNOSIS — M9903 Segmental and somatic dysfunction of lumbar region: Secondary | ICD-10-CM | POA: Diagnosis not present

## 2019-11-05 DIAGNOSIS — M62830 Muscle spasm of back: Secondary | ICD-10-CM | POA: Diagnosis not present

## 2019-11-05 DIAGNOSIS — M9904 Segmental and somatic dysfunction of sacral region: Secondary | ICD-10-CM | POA: Diagnosis not present

## 2019-11-07 DIAGNOSIS — M62830 Muscle spasm of back: Secondary | ICD-10-CM | POA: Diagnosis not present

## 2019-11-07 DIAGNOSIS — M9904 Segmental and somatic dysfunction of sacral region: Secondary | ICD-10-CM | POA: Diagnosis not present

## 2019-11-07 DIAGNOSIS — M5137 Other intervertebral disc degeneration, lumbosacral region: Secondary | ICD-10-CM | POA: Diagnosis not present

## 2019-11-07 DIAGNOSIS — M9903 Segmental and somatic dysfunction of lumbar region: Secondary | ICD-10-CM | POA: Diagnosis not present

## 2019-11-07 DIAGNOSIS — M5442 Lumbago with sciatica, left side: Secondary | ICD-10-CM | POA: Diagnosis not present

## 2019-11-07 DIAGNOSIS — M5136 Other intervertebral disc degeneration, lumbar region: Secondary | ICD-10-CM | POA: Diagnosis not present

## 2019-11-10 DIAGNOSIS — M9904 Segmental and somatic dysfunction of sacral region: Secondary | ICD-10-CM | POA: Diagnosis not present

## 2019-11-10 DIAGNOSIS — M5136 Other intervertebral disc degeneration, lumbar region: Secondary | ICD-10-CM | POA: Diagnosis not present

## 2019-11-10 DIAGNOSIS — M9903 Segmental and somatic dysfunction of lumbar region: Secondary | ICD-10-CM | POA: Diagnosis not present

## 2019-11-10 DIAGNOSIS — M62830 Muscle spasm of back: Secondary | ICD-10-CM | POA: Diagnosis not present

## 2019-11-10 DIAGNOSIS — M5442 Lumbago with sciatica, left side: Secondary | ICD-10-CM | POA: Diagnosis not present

## 2019-11-10 DIAGNOSIS — M5137 Other intervertebral disc degeneration, lumbosacral region: Secondary | ICD-10-CM | POA: Diagnosis not present

## 2019-11-12 DIAGNOSIS — M5136 Other intervertebral disc degeneration, lumbar region: Secondary | ICD-10-CM | POA: Diagnosis not present

## 2019-11-12 DIAGNOSIS — M5137 Other intervertebral disc degeneration, lumbosacral region: Secondary | ICD-10-CM | POA: Diagnosis not present

## 2019-11-12 DIAGNOSIS — M62830 Muscle spasm of back: Secondary | ICD-10-CM | POA: Diagnosis not present

## 2019-11-12 DIAGNOSIS — M9904 Segmental and somatic dysfunction of sacral region: Secondary | ICD-10-CM | POA: Diagnosis not present

## 2019-11-12 DIAGNOSIS — M9903 Segmental and somatic dysfunction of lumbar region: Secondary | ICD-10-CM | POA: Diagnosis not present

## 2019-11-12 DIAGNOSIS — M5442 Lumbago with sciatica, left side: Secondary | ICD-10-CM | POA: Diagnosis not present

## 2019-11-14 DIAGNOSIS — M62830 Muscle spasm of back: Secondary | ICD-10-CM | POA: Diagnosis not present

## 2019-11-14 DIAGNOSIS — M5442 Lumbago with sciatica, left side: Secondary | ICD-10-CM | POA: Diagnosis not present

## 2019-11-14 DIAGNOSIS — M5136 Other intervertebral disc degeneration, lumbar region: Secondary | ICD-10-CM | POA: Diagnosis not present

## 2019-11-14 DIAGNOSIS — M9903 Segmental and somatic dysfunction of lumbar region: Secondary | ICD-10-CM | POA: Diagnosis not present

## 2019-11-14 DIAGNOSIS — M5137 Other intervertebral disc degeneration, lumbosacral region: Secondary | ICD-10-CM | POA: Diagnosis not present

## 2019-11-14 DIAGNOSIS — M9904 Segmental and somatic dysfunction of sacral region: Secondary | ICD-10-CM | POA: Diagnosis not present

## 2019-12-01 DIAGNOSIS — M5442 Lumbago with sciatica, left side: Secondary | ICD-10-CM | POA: Diagnosis not present

## 2019-12-01 DIAGNOSIS — M5136 Other intervertebral disc degeneration, lumbar region: Secondary | ICD-10-CM | POA: Diagnosis not present

## 2019-12-01 DIAGNOSIS — M9903 Segmental and somatic dysfunction of lumbar region: Secondary | ICD-10-CM | POA: Diagnosis not present

## 2019-12-01 DIAGNOSIS — M62830 Muscle spasm of back: Secondary | ICD-10-CM | POA: Diagnosis not present

## 2019-12-01 DIAGNOSIS — M9904 Segmental and somatic dysfunction of sacral region: Secondary | ICD-10-CM | POA: Diagnosis not present

## 2019-12-01 DIAGNOSIS — M5137 Other intervertebral disc degeneration, lumbosacral region: Secondary | ICD-10-CM | POA: Diagnosis not present

## 2019-12-25 DIAGNOSIS — Z961 Presence of intraocular lens: Secondary | ICD-10-CM | POA: Diagnosis not present

## 2019-12-25 DIAGNOSIS — H26491 Other secondary cataract, right eye: Secondary | ICD-10-CM | POA: Diagnosis not present

## 2019-12-25 DIAGNOSIS — H59812 Chorioretinal scars after surgery for detachment, left eye: Secondary | ICD-10-CM | POA: Diagnosis not present

## 2019-12-25 DIAGNOSIS — H524 Presbyopia: Secondary | ICD-10-CM | POA: Diagnosis not present

## 2019-12-25 DIAGNOSIS — H401131 Primary open-angle glaucoma, bilateral, mild stage: Secondary | ICD-10-CM | POA: Diagnosis not present

## 2019-12-29 DIAGNOSIS — M5136 Other intervertebral disc degeneration, lumbar region: Secondary | ICD-10-CM | POA: Diagnosis not present

## 2019-12-29 DIAGNOSIS — M5442 Lumbago with sciatica, left side: Secondary | ICD-10-CM | POA: Diagnosis not present

## 2019-12-29 DIAGNOSIS — M9903 Segmental and somatic dysfunction of lumbar region: Secondary | ICD-10-CM | POA: Diagnosis not present

## 2019-12-29 DIAGNOSIS — M9904 Segmental and somatic dysfunction of sacral region: Secondary | ICD-10-CM | POA: Diagnosis not present

## 2019-12-29 DIAGNOSIS — M5137 Other intervertebral disc degeneration, lumbosacral region: Secondary | ICD-10-CM | POA: Diagnosis not present

## 2019-12-29 DIAGNOSIS — M62830 Muscle spasm of back: Secondary | ICD-10-CM | POA: Diagnosis not present

## 2020-01-30 DIAGNOSIS — Z79899 Other long term (current) drug therapy: Secondary | ICD-10-CM | POA: Diagnosis not present

## 2020-01-30 DIAGNOSIS — R519 Headache, unspecified: Secondary | ICD-10-CM | POA: Diagnosis not present

## 2020-01-30 DIAGNOSIS — R252 Cramp and spasm: Secondary | ICD-10-CM | POA: Diagnosis not present

## 2020-02-12 DIAGNOSIS — R69 Illness, unspecified: Secondary | ICD-10-CM | POA: Diagnosis not present

## 2020-02-20 DIAGNOSIS — K591 Functional diarrhea: Secondary | ICD-10-CM | POA: Diagnosis not present

## 2020-02-20 DIAGNOSIS — R531 Weakness: Secondary | ICD-10-CM | POA: Diagnosis not present

## 2020-02-20 DIAGNOSIS — R197 Diarrhea, unspecified: Secondary | ICD-10-CM | POA: Diagnosis not present

## 2020-03-02 DIAGNOSIS — H59812 Chorioretinal scars after surgery for detachment, left eye: Secondary | ICD-10-CM | POA: Diagnosis not present

## 2020-03-02 DIAGNOSIS — H5711 Ocular pain, right eye: Secondary | ICD-10-CM | POA: Diagnosis not present

## 2020-03-02 DIAGNOSIS — H401131 Primary open-angle glaucoma, bilateral, mild stage: Secondary | ICD-10-CM | POA: Diagnosis not present

## 2020-03-02 DIAGNOSIS — Z961 Presence of intraocular lens: Secondary | ICD-10-CM | POA: Diagnosis not present

## 2020-03-02 DIAGNOSIS — H26491 Other secondary cataract, right eye: Secondary | ICD-10-CM | POA: Diagnosis not present

## 2020-04-21 DIAGNOSIS — Z6825 Body mass index (BMI) 25.0-25.9, adult: Secondary | ICD-10-CM | POA: Diagnosis not present

## 2020-04-21 DIAGNOSIS — K219 Gastro-esophageal reflux disease without esophagitis: Secondary | ICD-10-CM | POA: Diagnosis not present

## 2020-04-21 DIAGNOSIS — I10 Essential (primary) hypertension: Secondary | ICD-10-CM | POA: Diagnosis not present

## 2020-04-21 DIAGNOSIS — E1169 Type 2 diabetes mellitus with other specified complication: Secondary | ICD-10-CM | POA: Diagnosis not present

## 2020-04-21 DIAGNOSIS — M19042 Primary osteoarthritis, left hand: Secondary | ICD-10-CM | POA: Diagnosis not present

## 2020-04-21 DIAGNOSIS — I6523 Occlusion and stenosis of bilateral carotid arteries: Secondary | ICD-10-CM | POA: Diagnosis not present

## 2020-04-21 DIAGNOSIS — M19041 Primary osteoarthritis, right hand: Secondary | ICD-10-CM | POA: Diagnosis not present

## 2020-08-04 DIAGNOSIS — H26491 Other secondary cataract, right eye: Secondary | ICD-10-CM | POA: Diagnosis not present

## 2020-08-04 DIAGNOSIS — H401131 Primary open-angle glaucoma, bilateral, mild stage: Secondary | ICD-10-CM | POA: Diagnosis not present

## 2020-08-04 DIAGNOSIS — H59812 Chorioretinal scars after surgery for detachment, left eye: Secondary | ICD-10-CM | POA: Diagnosis not present

## 2020-08-04 DIAGNOSIS — Z961 Presence of intraocular lens: Secondary | ICD-10-CM | POA: Diagnosis not present

## 2020-08-17 DIAGNOSIS — E785 Hyperlipidemia, unspecified: Secondary | ICD-10-CM | POA: Diagnosis not present

## 2020-08-17 DIAGNOSIS — Z0000 Encounter for general adult medical examination without abnormal findings: Secondary | ICD-10-CM | POA: Diagnosis not present

## 2020-08-17 DIAGNOSIS — E1169 Type 2 diabetes mellitus with other specified complication: Secondary | ICD-10-CM | POA: Diagnosis not present

## 2020-08-17 DIAGNOSIS — Z1331 Encounter for screening for depression: Secondary | ICD-10-CM | POA: Diagnosis not present

## 2020-08-17 DIAGNOSIS — Z1159 Encounter for screening for other viral diseases: Secondary | ICD-10-CM | POA: Diagnosis not present

## 2020-08-17 DIAGNOSIS — Z1339 Encounter for screening examination for other mental health and behavioral disorders: Secondary | ICD-10-CM | POA: Diagnosis not present

## 2020-08-17 DIAGNOSIS — Z6827 Body mass index (BMI) 27.0-27.9, adult: Secondary | ICD-10-CM | POA: Diagnosis not present

## 2020-08-17 DIAGNOSIS — Z79899 Other long term (current) drug therapy: Secondary | ICD-10-CM | POA: Diagnosis not present

## 2020-08-17 DIAGNOSIS — R0981 Nasal congestion: Secondary | ICD-10-CM | POA: Diagnosis not present

## 2020-08-17 DIAGNOSIS — C61 Malignant neoplasm of prostate: Secondary | ICD-10-CM | POA: Diagnosis not present

## 2020-08-17 DIAGNOSIS — I10 Essential (primary) hypertension: Secondary | ICD-10-CM | POA: Diagnosis not present

## 2020-09-02 DIAGNOSIS — H401131 Primary open-angle glaucoma, bilateral, mild stage: Secondary | ICD-10-CM | POA: Diagnosis not present

## 2020-09-02 DIAGNOSIS — H59812 Chorioretinal scars after surgery for detachment, left eye: Secondary | ICD-10-CM | POA: Diagnosis not present

## 2020-09-02 DIAGNOSIS — H26491 Other secondary cataract, right eye: Secondary | ICD-10-CM | POA: Diagnosis not present

## 2020-09-02 DIAGNOSIS — Z961 Presence of intraocular lens: Secondary | ICD-10-CM | POA: Diagnosis not present

## 2020-09-30 DIAGNOSIS — M25551 Pain in right hip: Secondary | ICD-10-CM | POA: Diagnosis not present

## 2020-09-30 DIAGNOSIS — M545 Low back pain, unspecified: Secondary | ICD-10-CM | POA: Diagnosis not present

## 2020-11-19 DIAGNOSIS — M5136 Other intervertebral disc degeneration, lumbar region: Secondary | ICD-10-CM | POA: Diagnosis not present

## 2020-11-29 DIAGNOSIS — M5136 Other intervertebral disc degeneration, lumbar region: Secondary | ICD-10-CM | POA: Diagnosis not present

## 2020-11-29 DIAGNOSIS — R2689 Other abnormalities of gait and mobility: Secondary | ICD-10-CM | POA: Diagnosis not present

## 2020-11-29 DIAGNOSIS — M6281 Muscle weakness (generalized): Secondary | ICD-10-CM | POA: Diagnosis not present

## 2020-12-06 DIAGNOSIS — H401131 Primary open-angle glaucoma, bilateral, mild stage: Secondary | ICD-10-CM | POA: Diagnosis not present

## 2020-12-06 DIAGNOSIS — M5136 Other intervertebral disc degeneration, lumbar region: Secondary | ICD-10-CM | POA: Diagnosis not present

## 2020-12-06 DIAGNOSIS — H524 Presbyopia: Secondary | ICD-10-CM | POA: Diagnosis not present

## 2020-12-06 DIAGNOSIS — Z961 Presence of intraocular lens: Secondary | ICD-10-CM | POA: Diagnosis not present

## 2020-12-06 DIAGNOSIS — R2689 Other abnormalities of gait and mobility: Secondary | ICD-10-CM | POA: Diagnosis not present

## 2020-12-06 DIAGNOSIS — H59812 Chorioretinal scars after surgery for detachment, left eye: Secondary | ICD-10-CM | POA: Diagnosis not present

## 2020-12-06 DIAGNOSIS — H26491 Other secondary cataract, right eye: Secondary | ICD-10-CM | POA: Diagnosis not present

## 2020-12-06 DIAGNOSIS — M6281 Muscle weakness (generalized): Secondary | ICD-10-CM | POA: Diagnosis not present

## 2020-12-13 DIAGNOSIS — R2689 Other abnormalities of gait and mobility: Secondary | ICD-10-CM | POA: Diagnosis not present

## 2020-12-13 DIAGNOSIS — M6281 Muscle weakness (generalized): Secondary | ICD-10-CM | POA: Diagnosis not present

## 2020-12-13 DIAGNOSIS — M5136 Other intervertebral disc degeneration, lumbar region: Secondary | ICD-10-CM | POA: Diagnosis not present

## 2020-12-20 DIAGNOSIS — R2689 Other abnormalities of gait and mobility: Secondary | ICD-10-CM | POA: Diagnosis not present

## 2020-12-20 DIAGNOSIS — M5136 Other intervertebral disc degeneration, lumbar region: Secondary | ICD-10-CM | POA: Diagnosis not present

## 2020-12-20 DIAGNOSIS — M6281 Muscle weakness (generalized): Secondary | ICD-10-CM | POA: Diagnosis not present

## 2020-12-27 DIAGNOSIS — R2689 Other abnormalities of gait and mobility: Secondary | ICD-10-CM | POA: Diagnosis not present

## 2020-12-27 DIAGNOSIS — M5136 Other intervertebral disc degeneration, lumbar region: Secondary | ICD-10-CM | POA: Diagnosis not present

## 2020-12-27 DIAGNOSIS — M6281 Muscle weakness (generalized): Secondary | ICD-10-CM | POA: Diagnosis not present

## 2021-01-03 DIAGNOSIS — M5136 Other intervertebral disc degeneration, lumbar region: Secondary | ICD-10-CM | POA: Diagnosis not present

## 2021-01-03 DIAGNOSIS — R2689 Other abnormalities of gait and mobility: Secondary | ICD-10-CM | POA: Diagnosis not present

## 2021-01-03 DIAGNOSIS — M6281 Muscle weakness (generalized): Secondary | ICD-10-CM | POA: Diagnosis not present

## 2021-04-18 DIAGNOSIS — Z6826 Body mass index (BMI) 26.0-26.9, adult: Secondary | ICD-10-CM | POA: Diagnosis not present

## 2021-04-18 DIAGNOSIS — E785 Hyperlipidemia, unspecified: Secondary | ICD-10-CM | POA: Diagnosis not present

## 2021-04-18 DIAGNOSIS — Z79899 Other long term (current) drug therapy: Secondary | ICD-10-CM | POA: Diagnosis not present

## 2021-04-18 DIAGNOSIS — G2581 Restless legs syndrome: Secondary | ICD-10-CM | POA: Diagnosis not present

## 2021-04-18 DIAGNOSIS — K219 Gastro-esophageal reflux disease without esophagitis: Secondary | ICD-10-CM | POA: Diagnosis not present

## 2021-04-18 DIAGNOSIS — I10 Essential (primary) hypertension: Secondary | ICD-10-CM | POA: Diagnosis not present

## 2021-04-18 DIAGNOSIS — E1169 Type 2 diabetes mellitus with other specified complication: Secondary | ICD-10-CM | POA: Diagnosis not present

## 2021-06-08 DIAGNOSIS — H26491 Other secondary cataract, right eye: Secondary | ICD-10-CM | POA: Diagnosis not present

## 2021-06-08 DIAGNOSIS — H59812 Chorioretinal scars after surgery for detachment, left eye: Secondary | ICD-10-CM | POA: Diagnosis not present

## 2021-06-08 DIAGNOSIS — Z961 Presence of intraocular lens: Secondary | ICD-10-CM | POA: Diagnosis not present

## 2021-06-08 DIAGNOSIS — H524 Presbyopia: Secondary | ICD-10-CM | POA: Diagnosis not present

## 2021-06-08 DIAGNOSIS — H401131 Primary open-angle glaucoma, bilateral, mild stage: Secondary | ICD-10-CM | POA: Diagnosis not present

## 2021-07-05 DIAGNOSIS — M1712 Unilateral primary osteoarthritis, left knee: Secondary | ICD-10-CM | POA: Diagnosis not present

## 2021-07-05 DIAGNOSIS — G8929 Other chronic pain: Secondary | ICD-10-CM | POA: Diagnosis not present

## 2021-07-05 DIAGNOSIS — M25562 Pain in left knee: Secondary | ICD-10-CM | POA: Diagnosis not present

## 2021-07-29 DIAGNOSIS — R070 Pain in throat: Secondary | ICD-10-CM | POA: Diagnosis not present

## 2021-07-29 DIAGNOSIS — J111 Influenza due to unidentified influenza virus with other respiratory manifestations: Secondary | ICD-10-CM | POA: Diagnosis not present

## 2021-08-23 DIAGNOSIS — Z1331 Encounter for screening for depression: Secondary | ICD-10-CM | POA: Diagnosis not present

## 2021-08-23 DIAGNOSIS — Z125 Encounter for screening for malignant neoplasm of prostate: Secondary | ICD-10-CM | POA: Diagnosis not present

## 2021-08-23 DIAGNOSIS — Z6826 Body mass index (BMI) 26.0-26.9, adult: Secondary | ICD-10-CM | POA: Diagnosis not present

## 2021-08-23 DIAGNOSIS — E1169 Type 2 diabetes mellitus with other specified complication: Secondary | ICD-10-CM | POA: Diagnosis not present

## 2021-08-23 DIAGNOSIS — C61 Malignant neoplasm of prostate: Secondary | ICD-10-CM | POA: Diagnosis not present

## 2021-08-23 DIAGNOSIS — Z0001 Encounter for general adult medical examination with abnormal findings: Secondary | ICD-10-CM | POA: Diagnosis not present

## 2021-08-23 DIAGNOSIS — Z1339 Encounter for screening examination for other mental health and behavioral disorders: Secondary | ICD-10-CM | POA: Diagnosis not present

## 2021-09-26 DIAGNOSIS — D649 Anemia, unspecified: Secondary | ICD-10-CM | POA: Diagnosis not present

## 2021-10-10 DIAGNOSIS — M25562 Pain in left knee: Secondary | ICD-10-CM | POA: Diagnosis not present

## 2021-10-10 DIAGNOSIS — G8929 Other chronic pain: Secondary | ICD-10-CM | POA: Diagnosis not present

## 2021-10-10 DIAGNOSIS — M1712 Unilateral primary osteoarthritis, left knee: Secondary | ICD-10-CM | POA: Diagnosis not present

## 2021-10-17 DIAGNOSIS — M25562 Pain in left knee: Secondary | ICD-10-CM | POA: Diagnosis not present

## 2021-10-19 DIAGNOSIS — M25562 Pain in left knee: Secondary | ICD-10-CM | POA: Diagnosis not present

## 2021-10-19 DIAGNOSIS — G8929 Other chronic pain: Secondary | ICD-10-CM | POA: Diagnosis not present

## 2021-10-19 DIAGNOSIS — S83282D Other tear of lateral meniscus, current injury, left knee, subsequent encounter: Secondary | ICD-10-CM | POA: Diagnosis not present

## 2021-10-19 DIAGNOSIS — M1712 Unilateral primary osteoarthritis, left knee: Secondary | ICD-10-CM | POA: Diagnosis not present

## 2021-10-19 DIAGNOSIS — S83232D Complex tear of medial meniscus, current injury, left knee, subsequent encounter: Secondary | ICD-10-CM | POA: Diagnosis not present

## 2021-12-07 DIAGNOSIS — H26491 Other secondary cataract, right eye: Secondary | ICD-10-CM | POA: Diagnosis not present

## 2021-12-07 DIAGNOSIS — H59812 Chorioretinal scars after surgery for detachment, left eye: Secondary | ICD-10-CM | POA: Diagnosis not present

## 2021-12-07 DIAGNOSIS — H401131 Primary open-angle glaucoma, bilateral, mild stage: Secondary | ICD-10-CM | POA: Diagnosis not present

## 2021-12-07 DIAGNOSIS — Z961 Presence of intraocular lens: Secondary | ICD-10-CM | POA: Diagnosis not present

## 2021-12-12 DIAGNOSIS — H524 Presbyopia: Secondary | ICD-10-CM | POA: Diagnosis not present

## 2022-01-25 DIAGNOSIS — R053 Chronic cough: Secondary | ICD-10-CM | POA: Diagnosis not present

## 2022-01-26 DIAGNOSIS — R059 Cough, unspecified: Secondary | ICD-10-CM | POA: Diagnosis not present

## 2022-01-26 DIAGNOSIS — R053 Chronic cough: Secondary | ICD-10-CM | POA: Diagnosis not present

## 2022-02-10 DIAGNOSIS — J40 Bronchitis, not specified as acute or chronic: Secondary | ICD-10-CM | POA: Diagnosis not present

## 2022-02-10 DIAGNOSIS — R0982 Postnasal drip: Secondary | ICD-10-CM | POA: Diagnosis not present

## 2022-02-21 DIAGNOSIS — I10 Essential (primary) hypertension: Secondary | ICD-10-CM | POA: Diagnosis not present

## 2022-02-21 DIAGNOSIS — Z79899 Other long term (current) drug therapy: Secondary | ICD-10-CM | POA: Diagnosis not present

## 2022-02-21 DIAGNOSIS — E1169 Type 2 diabetes mellitus with other specified complication: Secondary | ICD-10-CM | POA: Diagnosis not present

## 2022-02-21 DIAGNOSIS — M1710 Unilateral primary osteoarthritis, unspecified knee: Secondary | ICD-10-CM | POA: Diagnosis not present

## 2022-02-21 DIAGNOSIS — E785 Hyperlipidemia, unspecified: Secondary | ICD-10-CM | POA: Diagnosis not present

## 2022-02-21 DIAGNOSIS — Z6824 Body mass index (BMI) 24.0-24.9, adult: Secondary | ICD-10-CM | POA: Diagnosis not present

## 2022-02-21 DIAGNOSIS — K219 Gastro-esophageal reflux disease without esophagitis: Secondary | ICD-10-CM | POA: Diagnosis not present

## 2022-02-28 DIAGNOSIS — Z125 Encounter for screening for malignant neoplasm of prostate: Secondary | ICD-10-CM | POA: Diagnosis not present

## 2022-03-16 DIAGNOSIS — M1712 Unilateral primary osteoarthritis, left knee: Secondary | ICD-10-CM | POA: Insufficient documentation

## 2022-03-24 DIAGNOSIS — M1711 Unilateral primary osteoarthritis, right knee: Secondary | ICD-10-CM | POA: Diagnosis not present

## 2022-03-24 DIAGNOSIS — M170 Bilateral primary osteoarthritis of knee: Secondary | ICD-10-CM | POA: Diagnosis not present

## 2022-05-04 DIAGNOSIS — M1712 Unilateral primary osteoarthritis, left knee: Secondary | ICD-10-CM | POA: Diagnosis not present

## 2022-05-08 DIAGNOSIS — M4316 Spondylolisthesis, lumbar region: Secondary | ICD-10-CM | POA: Diagnosis not present

## 2022-05-08 DIAGNOSIS — M5416 Radiculopathy, lumbar region: Secondary | ICD-10-CM | POA: Diagnosis not present

## 2022-05-08 DIAGNOSIS — M5136 Other intervertebral disc degeneration, lumbar region: Secondary | ICD-10-CM | POA: Diagnosis not present

## 2022-06-13 DIAGNOSIS — M1712 Unilateral primary osteoarthritis, left knee: Secondary | ICD-10-CM | POA: Diagnosis not present

## 2022-06-13 DIAGNOSIS — Z961 Presence of intraocular lens: Secondary | ICD-10-CM | POA: Diagnosis not present

## 2022-06-13 DIAGNOSIS — H26491 Other secondary cataract, right eye: Secondary | ICD-10-CM | POA: Diagnosis not present

## 2022-06-13 DIAGNOSIS — H59812 Chorioretinal scars after surgery for detachment, left eye: Secondary | ICD-10-CM | POA: Diagnosis not present

## 2022-06-13 DIAGNOSIS — H401131 Primary open-angle glaucoma, bilateral, mild stage: Secondary | ICD-10-CM | POA: Diagnosis not present

## 2022-06-20 DIAGNOSIS — M1712 Unilateral primary osteoarthritis, left knee: Secondary | ICD-10-CM | POA: Diagnosis not present

## 2022-06-27 DIAGNOSIS — M1712 Unilateral primary osteoarthritis, left knee: Secondary | ICD-10-CM | POA: Diagnosis not present

## 2022-07-18 DIAGNOSIS — M1710 Unilateral primary osteoarthritis, unspecified knee: Secondary | ICD-10-CM | POA: Diagnosis not present

## 2022-07-18 DIAGNOSIS — I10 Essential (primary) hypertension: Secondary | ICD-10-CM | POA: Diagnosis not present

## 2022-07-18 DIAGNOSIS — E1169 Type 2 diabetes mellitus with other specified complication: Secondary | ICD-10-CM | POA: Diagnosis not present

## 2022-08-15 DIAGNOSIS — M25562 Pain in left knee: Secondary | ICD-10-CM | POA: Diagnosis not present

## 2022-08-15 DIAGNOSIS — G8929 Other chronic pain: Secondary | ICD-10-CM | POA: Diagnosis not present

## 2022-08-15 DIAGNOSIS — M1712 Unilateral primary osteoarthritis, left knee: Secondary | ICD-10-CM | POA: Diagnosis not present

## 2022-08-24 DIAGNOSIS — R9431 Abnormal electrocardiogram [ECG] [EKG]: Secondary | ICD-10-CM | POA: Diagnosis not present

## 2022-08-24 DIAGNOSIS — E785 Hyperlipidemia, unspecified: Secondary | ICD-10-CM | POA: Diagnosis not present

## 2022-08-24 DIAGNOSIS — Z0000 Encounter for general adult medical examination without abnormal findings: Secondary | ICD-10-CM | POA: Diagnosis not present

## 2022-08-24 DIAGNOSIS — Z79899 Other long term (current) drug therapy: Secondary | ICD-10-CM | POA: Diagnosis not present

## 2022-08-24 DIAGNOSIS — Z01818 Encounter for other preprocedural examination: Secondary | ICD-10-CM | POA: Diagnosis not present

## 2022-08-24 DIAGNOSIS — I10 Essential (primary) hypertension: Secondary | ICD-10-CM | POA: Diagnosis not present

## 2022-08-24 DIAGNOSIS — Z8546 Personal history of malignant neoplasm of prostate: Secondary | ICD-10-CM | POA: Diagnosis not present

## 2022-08-24 DIAGNOSIS — E1169 Type 2 diabetes mellitus with other specified complication: Secondary | ICD-10-CM | POA: Diagnosis not present

## 2022-08-24 DIAGNOSIS — Z6825 Body mass index (BMI) 25.0-25.9, adult: Secondary | ICD-10-CM | POA: Diagnosis not present

## 2022-08-24 DIAGNOSIS — Z1331 Encounter for screening for depression: Secondary | ICD-10-CM | POA: Diagnosis not present

## 2022-08-29 NOTE — Progress Notes (Unsigned)
Cardiology Office Note:   Date:  08/31/2022  NAME:  Troy Wu    MRN: [id number] DOB:  08/16/1946   PCP:  Ernestene Kiel, MD  Cardiologist:  None  Electrophysiologist:  None   Referring MD: Ernestene Kiel, MD   Chief Complaint  Patient presents with   Abnormal ECG    History of Present Illness:   Troy Wu is a 76 y.o. male with a hx of HTN, HLD who is being seen today for the evaluation of abnormal EKG at the request of Ernestene Kiel, MD. recently seen by primary care physician.  EKG with lateral T wave inversions.  He has high blood pressure.  Has had this for quite some time.  Blood pressure today in office 168/70.  Recently added on doxazosin.  Plan for follow-up on Tuesday for further titration with primary care physician.  I told him to keep a close eye on this.  His blood pressure is elevated and he may need additional medication.  He reports no chest pain or trouble breathing.  He has never had a heart attack or stroke.  His lab work does show that he is diabetic with an A1c of 7.3.  He also has a left carotid bruit.  This is never been evaluated.  Will set him up for an echo as well as carotid ultrasounds.  He is a former smoker.  He has not smoked in 20 years.  No alcohol or drug use is reported.  He still works as a Games developer.  He reports he put a roof on last week.  No chest pain or trouble breathing.  I did inform him that it would be safe for him to go to knee surgery but we would like to evaluate his heart and carotid arteries prior to surgery.  He really is without complaints.  His exam is normal.  He is married.  He has 4 children.  He has several grandchildren and great-grandchildren.  His EKG does demonstrate lateral T wave inversions.  Again no history of heart attack.   He reports he will have knee surgery.  Again without any complaints today.  He does report pain in his knees.  He can climb a flight of stairs if needed.  EKG with PCP with NSR 63  bpm and lateral TWI  Problem List HTN HLD -T chol 187, HDL 48, LDL 126, TG 69 3. DM  -A1c 7.3  Past Medical History: Past Medical History:  Diagnosis Date   Arthritis    Detached retina, left    x2   Diabetes mellitus without complication (HCC)    GERD (gastroesophageal reflux disease)    Hard of hearing    wears bilateral hearing aids   Hypertension    PONV (postoperative nausea and vomiting)    Prostate cancer ([place])    Sleep apnea    does not use CPAP    Past Surgical History: Past Surgical History:  Procedure Laterality Date   CATARACT EXTRACTION, BILATERAL     CHOLECYSTECTOMY     EYE SURGERY     HERNIA REPAIR     LUMBAR LAMINECTOMY/DECOMPRESSION MICRODISCECTOMY Right 08/14/2018   Procedure: Right L4-5 decompression/disectomy;  Surgeon: Melina Schools, MD;  Location: [facility];  Service: Orthopedics;  Laterality: Right;  2.5 hrs   RETINAL DETACHMENT SURGERY     VASECTOMY      Current Medications: Current Meds  Medication Sig   amLODipine (NORVASC) 5 MG tablet Take 5 mg by mouth daily.  brimonidine (ALPHAGAN) 0.2 % ophthalmic solution INSTILL 1 DROP INTO LEFT EYE EVERY MORNING   doxazosin (CARDURA) 2 MG tablet Take 2 mg by mouth daily.   latanoprost (XALATAN) 0.005 % ophthalmic solution Place 1 drop into both eyes at bedtime.   omeprazole (PRILOSEC) 20 MG capsule Take 20 mg by mouth daily.   tamsulosin (FLOMAX) 0.4 MG CAPS capsule Take 0.4 mg by mouth daily.   telmisartan (MICARDIS) 80 MG tablet Take 80 mg by mouth daily.   timolol (BETIMOL) 0.25 % ophthalmic solution Place 1 drop into both eyes daily.      Allergies:    Codeine   Social History: Social History   Socioeconomic History   Marital status: Married    Spouse name: Not on file   Number of children: 4   Years of education: Not on file   Highest education level: Not on file  Occupational History   Occupation: Carpenter  Tobacco Use   Smoking status: Former    Packs/day: 2.00    Years: 30.00     Total pack years: 60.00    Types: Cigarettes    Quit date: 07/19/1998    Years since quitting: 24.1   Smokeless tobacco: Never  Vaping Use   Vaping Use: Never used  Substance and Sexual Activity   Alcohol use: Never   Drug use: Never   Sexual activity: Not on file  Other Topics Concern   Not on file  Social History Narrative   Not on file   Social Determinants of Health   Financial Resource Strain: Not on file  Food Insecurity: Not on file  Transportation Needs: Not on file  Physical Activity: Not on file  Stress: Not on file  Social Connections: Not on file     Family History: The patient's family history includes Hypertension in his father and mother.  ROS:   All other ROS reviewed and negative. Pertinent positives noted in the HPI.     EKGs/Labs/Other Studies Reviewed:   The following studies were personally reviewed by me today:  EKG:  EKG is ordered today.  The ekg ordered today demonstrates normal sinus rhythm heart rate 64, lateral T wave inversions, and was personally reviewed by me.   Recent Labs: No results found for requested labs within last 365 days.   Recent Lipid Panel No results found for: "CHOL", "TRIG", "HDL", "CHOLHDL", "VLDL", "LDLCALC", "LDLDIRECT"  Physical Exam:   VS:  BP (!) 168/70   Pulse 64   Ht '5\' 9"'$  (1.753 m)   Wt 173 lb 3.2 oz (78.6 kg)   SpO2 100%   BMI 25.58 kg/m    Wt Readings from Last 3 Encounters:  08/31/22 173 lb 3.2 oz (78.6 kg)  08/14/18 183 lb 6.4 oz (83.2 kg)  08/06/18 183 lb 6.4 oz (83.2 kg)    General: Well nourished, well developed, in no acute distress Head: Atraumatic, normal size  Eyes: PEERLA, EOMI  Neck: Supple, no JVD Endocrine: No thryomegaly Cardiac: Normal S1, S2; RRR; no murmurs, rubs, or gallops Lungs: Clear to auscultation bilaterally, no wheezing, rhonchi or rales  Abd: Soft, nontender, no hepatomegaly  Ext: No edema, pulses 2+ Musculoskeletal: No deformities, BUE and BLE strength normal and  equal Skin: Warm and dry, no rashes   Neuro: Alert and oriented to person, place, time, and situation, CNII-XII grossly intact, no focal deficits  Psych: Normal mood and affect   ASSESSMENT:   Troy Wu is a 76 y.o. male who presents for  the following: 1. Nonspecific abnormal electrocardiogram (ECG) (EKG)   2. Primary hypertension   3. Preoperative cardiovascular examination   4. Bilateral carotid bruits     PLAN:   1. Nonspecific abnormal electrocardiogram (ECG) (EKG) -EK G with lateral T wave inversions.  Suspect this is blood pressure related.  No symptoms of angina.  We will set him up for an echocardiogram.  No need for stress test.  He reports no symptoms of chest pain or trouble breathing.  2. Primary hypertension -Blood pressure elevated today.  Currently on doxazosin 2 mg daily, amlodipine 5 mg daily, telmisartan 80 mg daily.  He will follow-up on Tuesday with his primary care physician.  Suspect he will need additional medication or to increase his amlodipine.  He would likely benefit from a diuretic such as HCTZ given his age.  3. Preoperative cardiovascular examination -He will have knee surgery.  He can complete greater than 4 METS.  He reports no chest pain or trouble breathing.  We are going to obtain an echocardiogram preoperatively for T wave inversions which I suspect are blood pressure related.  He also needs carotid ultrasounds given his left carotid bruit.  Overall suspect he will be able to proceed with surgery but he will need this testing first. -The Revised Cardiac Risk Index = 0 which equates to a 0.4% (very low risk) estimated risk of perioperative myocardial infarction, pulmonary edema, ventricular fibrillation, cardiac arrest or complete heart block.  Given his ability to complete greater than 4 METS suspect he will be okay for surgery pending about echo and carotid ultrasounds.  4. Bilateral carotid bruits -Carotid bruit in the left carotid artery.  No  strokelike symptoms.  Carotid ultrasound ordered.  He will likely end up needing aspirin and statin.  I will see him back in 6 months to discuss further.  Disposition: Return in about 6 months (around 03/02/2023).  Medication Adjustments/Labs and Tests Ordered: Current medicines are reviewed at length with the patient today.  Concerns regarding medicines are outlined above.  Orders Placed This Encounter  Procedures   EKG 12-Lead   ECHOCARDIOGRAM COMPLETE   VAS US CAROTID   No orders of the defined types were placed in this encounter.   Patient Instructions  Medication Instructions:  The current medical regimen is effective;  continue present plan and medications.  *If you need a refill on your cardiac medications before your next appointment, please call your pharmacy*   Testing/Procedures: Echocardiogram - Your physician has requested that you have an echocardiogram. Echocardiography is a painless test that uses sound waves to create images of your heart. It provides your doctor with information about the size and shape of your heart and how well your heart's chambers and valves are working. This procedure takes approximately one hour. There are no restrictions for this procedure.   Your physician has requested that you have a carotid duplex. This test is an ultrasound of the carotid arteries in your neck. It looks at blood flow through these arteries that supply the brain with blood. Allow one hour for this exam. There are no restrictions or special instructions.   Follow-Up: At [facility], you and your health needs are our priority.  As part of our continuing mission to provide you with exceptional heart care, we have created designated Provider Care Teams.  These Care Teams include your primary Cardiologist (physician) and Advanced Practice Providers (APPs -  Physician Assistants and Nurse Practitioners) who all work together to provide you  with the care you need, when you  need it.  We recommend signing up for the patient portal called "MyChart".  Sign up information is provided on this After Visit Summary.  MyChart is used to connect with patients for Virtual Visits (Telemedicine).  Patients are able to view lab/test results, encounter notes, upcoming appointments, etc.  Non-urgent messages can be sent to your provider as well.   To learn more about what you can do with MyChart, go to [email or web address].    Your next appointment:   6 month(s)  The format for your next appointment:   In Person  Provider:   Eleonore Chiquito, MD        Addison Naegeli. Audie Box, MD, [facility]  [address] [place], [place] [address] [phone]  08/31/2022 2:18 PM

## 2022-08-31 ENCOUNTER — Encounter: Payer: Self-pay | Admitting: Cardiovascular Disease

## 2022-08-31 ENCOUNTER — Ambulatory Visit: Payer: Medicare HMO | Attending: Cardiovascular Disease | Admitting: Cardiovascular Disease

## 2022-08-31 VITALS — BP 168/70 | HR 64 | Ht 69.0 in | Wt 173.2 lb

## 2022-08-31 DIAGNOSIS — I10 Essential (primary) hypertension: Secondary | ICD-10-CM | POA: Diagnosis not present

## 2022-08-31 DIAGNOSIS — R0989 Other specified symptoms and signs involving the circulatory and respiratory systems: Secondary | ICD-10-CM

## 2022-08-31 DIAGNOSIS — Z01810 Encounter for preprocedural cardiovascular examination: Secondary | ICD-10-CM | POA: Diagnosis not present

## 2022-08-31 DIAGNOSIS — R9431 Abnormal electrocardiogram [ECG] [EKG]: Secondary | ICD-10-CM

## 2022-08-31 NOTE — Patient Instructions (Signed)
Medication Instructions:  The current medical regimen is effective;  continue present plan and medications.  *If you need a refill on your cardiac medications before your next appointment, please call your pharmacy*   Testing/Procedures: Echocardiogram - Your physician has requested that you have an echocardiogram. Echocardiography is a painless test that uses sound waves to create images of your heart. It provides your doctor with information about the size and shape of your heart and how well your heart's chambers and valves are working. This procedure takes approximately one hour. There are no restrictions for this procedure.   Your physician has requested that you have a carotid duplex. This test is an ultrasound of the carotid arteries in your neck. It looks at blood flow through these arteries that supply the brain with blood. Allow one hour for this exam. There are no restrictions or special instructions.   Follow-Up: At [facility], you and your health needs are our priority.  As part of our continuing mission to provide you with exceptional heart care, we have created designated Provider Care Teams.  These Care Teams include your primary Cardiologist (physician) and Advanced Practice Providers (APPs -  Physician Assistants and Nurse Practitioners) who all work together to provide you with the care you need, when you need it.  We recommend signing up for the patient portal called "MyChart".  Sign up information is provided on this After Visit Summary.  MyChart is used to connect with patients for Virtual Visits (Telemedicine).  Patients are able to view lab/test results, encounter notes, upcoming appointments, etc.  Non-urgent messages can be sent to your provider as well.   To learn more about what you can do with MyChart, go to [email or web address].    Your next appointment:   6 month(s)  The format for your next appointment:   In Person  Provider:   Eleonore Chiquito,  MD

## 2022-09-01 ENCOUNTER — Ambulatory Visit (HOSPITAL_BASED_OUTPATIENT_CLINIC_OR_DEPARTMENT_OTHER)
Admission: RE | Admit: 2022-09-01 | Discharge: 2022-09-01 | Disposition: A | Discharge status: Home / Self Care | Payer: Medicare HMO | Source: Ambulatory Visit | Attending: Cardiovascular Disease | Admitting: Cardiovascular Disease

## 2022-09-01 DIAGNOSIS — R9431 Abnormal electrocardiogram [ECG] [EKG]: Secondary | ICD-10-CM | POA: Diagnosis not present

## 2022-09-01 LAB — ECHOCARDIOGRAM COMPLETE
Area-P 1/2: 2.99 cm2
S' Lateral: 3.3 cm

## 2022-09-04 ENCOUNTER — Ambulatory Visit (HOSPITAL_COMMUNITY)
Admission: RE | Admit: 2022-09-04 | Discharge: 2022-09-04 | Disposition: A | Discharge status: Home / Self Care | Payer: Medicare HMO | Source: Ambulatory Visit | Attending: Cardiology | Admitting: Cardiology

## 2022-09-04 DIAGNOSIS — R0989 Other specified symptoms and signs involving the circulatory and respiratory systems: Secondary | ICD-10-CM | POA: Diagnosis not present

## 2022-09-05 ENCOUNTER — Telehealth: Payer: Self-pay | Admitting: Cardiovascular Disease

## 2022-09-05 MED ORDER — ASPIRIN 81 MG PO TBEC: 81.0000 mg | DELAYED_RELEASE_TABLET | Freq: Every day | ORAL | 3 refills | Status: AC

## 2022-09-05 MED ORDER — ATORVASTATIN CALCIUM 40 MG PO TABS: 40.0000 mg | ORAL_TABLET | Freq: Every day | ORAL | 3 refills | Status: DC

## 2022-09-05 NOTE — Telephone Encounter (Signed)
Gave patient the results of echo and carotid dopplers per Dr. Kathalene Frames notes. Explained medications. Orders placed. Patient asked for results of echo and dopplers be faxed to PCP. Done

## 2022-09-05 NOTE — Telephone Encounter (Signed)
Patient is returning call to discuss echo results. °

## 2022-10-11 DIAGNOSIS — Z419 Encounter for procedure for purposes other than remedying health state, unspecified: Secondary | ICD-10-CM | POA: Diagnosis not present

## 2022-11-06 DIAGNOSIS — G8918 Other acute postprocedural pain: Secondary | ICD-10-CM | POA: Diagnosis not present

## 2022-11-06 DIAGNOSIS — M1712 Unilateral primary osteoarthritis, left knee: Secondary | ICD-10-CM | POA: Diagnosis not present

## 2022-11-06 DIAGNOSIS — Z96652 Presence of left artificial knee joint: Secondary | ICD-10-CM | POA: Diagnosis not present

## 2022-11-16 DIAGNOSIS — Z471 Aftercare following joint replacement surgery: Secondary | ICD-10-CM | POA: Diagnosis not present

## 2022-11-16 DIAGNOSIS — Z96652 Presence of left artificial knee joint: Secondary | ICD-10-CM | POA: Diagnosis not present

## 2022-11-17 DIAGNOSIS — R2689 Other abnormalities of gait and mobility: Secondary | ICD-10-CM | POA: Diagnosis not present

## 2022-11-17 DIAGNOSIS — M25562 Pain in left knee: Secondary | ICD-10-CM | POA: Diagnosis not present

## 2022-11-17 DIAGNOSIS — M25462 Effusion, left knee: Secondary | ICD-10-CM | POA: Diagnosis not present

## 2022-11-17 DIAGNOSIS — M25662 Stiffness of left knee, not elsewhere classified: Secondary | ICD-10-CM | POA: Diagnosis not present

## 2022-11-20 DIAGNOSIS — M25462 Effusion, left knee: Secondary | ICD-10-CM | POA: Diagnosis not present

## 2022-11-20 DIAGNOSIS — M25662 Stiffness of left knee, not elsewhere classified: Secondary | ICD-10-CM | POA: Diagnosis not present

## 2022-11-20 DIAGNOSIS — M25562 Pain in left knee: Secondary | ICD-10-CM | POA: Diagnosis not present

## 2022-11-20 DIAGNOSIS — R2689 Other abnormalities of gait and mobility: Secondary | ICD-10-CM | POA: Diagnosis not present

## 2022-11-23 DIAGNOSIS — M25462 Effusion, left knee: Secondary | ICD-10-CM | POA: Diagnosis not present

## 2022-11-23 DIAGNOSIS — M25562 Pain in left knee: Secondary | ICD-10-CM | POA: Diagnosis not present

## 2022-11-23 DIAGNOSIS — R2689 Other abnormalities of gait and mobility: Secondary | ICD-10-CM | POA: Diagnosis not present

## 2022-11-23 DIAGNOSIS — M25662 Stiffness of left knee, not elsewhere classified: Secondary | ICD-10-CM | POA: Diagnosis not present

## 2022-11-28 DIAGNOSIS — M25462 Effusion, left knee: Secondary | ICD-10-CM | POA: Diagnosis not present

## 2022-11-28 DIAGNOSIS — R2689 Other abnormalities of gait and mobility: Secondary | ICD-10-CM | POA: Diagnosis not present

## 2022-11-28 DIAGNOSIS — M25562 Pain in left knee: Secondary | ICD-10-CM | POA: Diagnosis not present

## 2022-11-28 DIAGNOSIS — M25662 Stiffness of left knee, not elsewhere classified: Secondary | ICD-10-CM | POA: Diagnosis not present

## 2022-12-01 DIAGNOSIS — R2689 Other abnormalities of gait and mobility: Secondary | ICD-10-CM | POA: Diagnosis not present

## 2022-12-01 DIAGNOSIS — M25462 Effusion, left knee: Secondary | ICD-10-CM | POA: Diagnosis not present

## 2022-12-01 DIAGNOSIS — M25562 Pain in left knee: Secondary | ICD-10-CM | POA: Diagnosis not present

## 2022-12-01 DIAGNOSIS — M25662 Stiffness of left knee, not elsewhere classified: Secondary | ICD-10-CM | POA: Diagnosis not present

## 2022-12-04 NOTE — Progress Notes (Signed)
Cardiology Office Note:   Date:  12/08/2022  NAME:  Troy Wu    MRN: [id number] DOB:  [birth year]   PCP:  Ernestene Kiel, MD  Cardiologist:  None  Electrophysiologist:  None   Referring MD: Ernestene Kiel, MD   Chief Complaint  Patient presents with   Follow-up    3 months.   Edema    Legs.   History of Present Illness:   Troy Wu is a 77 y.o. male with a hx of carotid artery disease, HLD, DM who presents for follow-up.  Left carotid artery 40 to 59%.  No strokelike symptoms.  On aspirin and Lipitor 40.  Needs recheck on lipids today.  Under trouble breathing.  Blood pressure is well-controlled.  He is working on his diabetes.  Without any complaints today.  Recovering from knee surgery.  Problem List HTN HLD -T chol 187, HDL 48, LDL 126, TG 69 3. DM  -A1c 7.3 4. Carotid artery disease  -R ICA 1-39% -L ICA 40-59%  Past Medical History: Past Medical History:  Diagnosis Date   Arthritis    Detached retina, left    x2   Diabetes mellitus without complication (HCC)    GERD (gastroesophageal reflux disease)    Hard of hearing    wears bilateral hearing aids   Hypertension    PONV (postoperative nausea and vomiting)    Prostate cancer ([place])    Sleep apnea    does not use CPAP    Past Surgical History: Past Surgical History:  Procedure Laterality Date   CATARACT EXTRACTION, BILATERAL     CHOLECYSTECTOMY     EYE SURGERY     HERNIA REPAIR     LUMBAR LAMINECTOMY/DECOMPRESSION MICRODISCECTOMY Right 08/14/2018   Procedure: Right L4-5 decompression/disectomy;  Surgeon: Melina Schools, MD;  Location: [facility];  Service: Orthopedics;  Laterality: Right;  2.5 hrs   RETINAL DETACHMENT SURGERY     VASECTOMY      Current Medications: Current Meds  Medication Sig   aspirin EC 81 MG tablet Take 1 tablet (81 mg total) by mouth daily. Swallow whole.   atorvastatin (LIPITOR) 40 MG tablet Take 1 tablet (40 mg total) by mouth daily.   brimonidine  (ALPHAGAN) 0.2 % ophthalmic solution INSTILL 1 DROP INTO LEFT EYE EVERY MORNING   doxazosin (CARDURA) 2 MG tablet Take 2 mg by mouth daily.   latanoprost (XALATAN) 0.005 % ophthalmic solution Place 1 drop into both eyes at bedtime.   omeprazole (PRILOSEC) 20 MG capsule Take 20 mg by mouth daily.   tamsulosin (FLOMAX) 0.4 MG CAPS capsule Take 0.4 mg by mouth daily.   telmisartan (MICARDIS) 80 MG tablet Take 80 mg by mouth daily.     Allergies:    Codeine   Social History: Social History   Socioeconomic History   Marital status: Married    Spouse name: Not on file   Number of children: 4   Years of education: Not on file   Highest education level: Not on file  Occupational History   Occupation: Carpenter  Tobacco Use   Smoking status: Former    Packs/day: 2.00    Years: 30.00    Additional pack years: 0.00    Total pack years: 60.00    Types: Cigarettes    Quit date: 07/19/1998    Years since quitting: 24.4   Smokeless tobacco: Never  Vaping Use   Vaping Use: Never used  Substance and Sexual Activity   Alcohol use: Never  Drug use: Never   Sexual activity: Not on file  Other Topics Concern   Not on file  Social History Narrative   Not on file   Social Determinants of Health   Financial Resource Strain: Not on file  Food Insecurity: Not on file  Transportation Needs: Not on file  Physical Activity: Not on file  Stress: Not on file  Social Connections: Not on file     Family History: The patient's family history includes Hypertension in his father and mother.  ROS:   All other ROS reviewed and negative. Pertinent positives noted in the HPI.     EKGs/Labs/Other Studies Reviewed:   The following studies were personally reviewed by me today:  TTE 09/01/2022  1. Left ventricular ejection fraction, by estimation, is 60 to 65%. The  left ventricle has normal function. The left ventricle has no regional  wall motion abnormalities. Left ventricular diastolic  parameters are  consistent with Grade I diastolic  dysfunction (impaired relaxation).GLS Avg -16.9%   2. Right ventricular systolic function is normal. The right ventricular  size is normal.   3. Left atrial size was mild to moderately dilated.   4. The mitral valve is normal in structure. No evidence of mitral valve  regurgitation. No evidence of mitral stenosis.   5. The aortic valve is normal in structure. Aortic valve regurgitation is  not visualized. No aortic stenosis is present.   6. The inferior vena cava is normal in size with greater than 50%  respiratory variability, suggesting right atrial pressure of 3 mmHg.   Recent Labs: No results found for requested labs within last 365 days.   Recent Lipid Panel No results found for: "CHOL", "TRIG", "HDL", "CHOLHDL", "VLDL", "LDLCALC", "LDLDIRECT"  Physical Exam:   VS:  BP 128/72 (BP Location: Left Arm, Patient Position: Sitting, Cuff Size: Normal)   Pulse 77   Ht 5\' 9"  (1.753 m)   Wt 167 lb (75.8 kg)   BMI 24.66 kg/m    Wt Readings from Last 3 Encounters:  12/08/22 167 lb (75.8 kg)  08/31/22 173 lb 3.2 oz (78.6 kg)  08/14/18 183 lb 6.4 oz (83.2 kg)    General: Well nourished, well developed, in no acute distress Head: Atraumatic, normal size  Eyes: PEERLA, EOMI  Neck: Carotid bruits bilaterally Endocrine: No thryomegaly Cardiac: Normal S1, S2; RRR; no murmurs, rubs, or gallops Lungs: Clear to auscultation bilaterally, no wheezing, rhonchi or rales  Abd: Soft, nontender, no hepatomegaly  Ext: No edema, pulses 2+ Musculoskeletal: No deformities, BUE and BLE strength normal and equal Skin: Warm and dry, no rashes   Neuro: Alert and oriented to person, place, time, and situation, CNII-XII grossly intact, no focal deficits  Psych: Normal mood and affect   ASSESSMENT:   Troy Wu is a 77 y.o. male who presents for the following: 1. Stenosis of left carotid artery   2. Mixed hyperlipidemia   3. Primary hypertension      PLAN:   1. Stenosis of left carotid artery 2. Mixed hyperlipidemia -Left ICA 40 to 59%.  No strokelike symptoms.  No indications for revascularization at this time.  We will continue with aspirin 81 mg daily.  Also on Lipitor 40 mg daily.  Recheck lipids today.  Goal LDL less than 55.  He needs to work on his diabetes.  He is exercising.  Denies any symptoms.  Blood pressure is well-controlled.  He will see me yearly with repeat ultrasound before that appointment.  Echo  normal.  No symptoms concerning for angina.  3. Primary hypertension -Well-controlled on Micardis 80 mg daily.  No change in medications.  Disposition: Return in about 1 year (around 12/08/2023).  Medication Adjustments/Labs and Tests Ordered: Current medicines are reviewed at length with the patient today.  Concerns regarding medicines are outlined above.  Orders Placed This Encounter  Procedures   Lipid panel   VAS US CAROTID   No orders of the defined types were placed in this encounter.   Patient Instructions  Medication Instructions:  The current medical regimen is effective;  continue present plan and medications.  *If you need a refill on your cardiac medications before your next appointment, please call your pharmacy*   Lab Work: LIPID today   If you have labs (blood work) drawn today and your tests are completely normal, you will receive your results only by: [place] (if you have MyChart) OR A paper copy in the mail If you have any lab test that is abnormal or we need to change your treatment, we will call you to review the results.   Testing/Procedures:  Your physician has requested that you have a carotid duplex (12 month). This test is an ultrasound of the carotid arteries in your neck. It looks at blood flow through these arteries that supply the brain with blood. Allow one hour for this exam. There are no restrictions or special instructions.   Follow-Up: At [facility],  you and your health needs are our priority.  As part of our continuing mission to provide you with exceptional heart care, we have created designated Provider Care Teams.  These Care Teams include your primary Cardiologist (physician) and Advanced Practice Providers (APPs -  Physician Assistants and Nurse Practitioners) who all work together to provide you with the care you need, when you need it.  We recommend signing up for the patient portal called "MyChart".  Sign up information is provided on this After Visit Summary.  MyChart is used to connect with patients for Virtual Visits (Telemedicine).  Patients are able to view lab/test results, encounter notes, upcoming appointments, etc.  Non-urgent messages can be sent to your provider as well.   To learn more about what you can do with MyChart, go to [email or web address].    Your next appointment:   12 month(s)  Provider:   Eleonore Chiquito, MD      Time Spent with Patient: I have spent a total of 25 minutes with patient reviewing hospital notes, telemetry, EKGs, labs and examining the patient as well as establishing an assessment and plan that was discussed with the patient.  > 50% of time was spent in direct patient care.  Signed, Addison Naegeli. Audie Box, MD, [place]  [address] [place], [place] [address] [phone]  12/08/2022 1:22 PM

## 2022-12-05 DIAGNOSIS — R2689 Other abnormalities of gait and mobility: Secondary | ICD-10-CM | POA: Diagnosis not present

## 2022-12-05 DIAGNOSIS — M25462 Effusion, left knee: Secondary | ICD-10-CM | POA: Diagnosis not present

## 2022-12-05 DIAGNOSIS — M25562 Pain in left knee: Secondary | ICD-10-CM | POA: Diagnosis not present

## 2022-12-05 DIAGNOSIS — M25662 Stiffness of left knee, not elsewhere classified: Secondary | ICD-10-CM | POA: Diagnosis not present

## 2022-12-07 DIAGNOSIS — R2689 Other abnormalities of gait and mobility: Secondary | ICD-10-CM | POA: Diagnosis not present

## 2022-12-07 DIAGNOSIS — M25562 Pain in left knee: Secondary | ICD-10-CM | POA: Diagnosis not present

## 2022-12-07 DIAGNOSIS — M25462 Effusion, left knee: Secondary | ICD-10-CM | POA: Diagnosis not present

## 2022-12-07 DIAGNOSIS — M25662 Stiffness of left knee, not elsewhere classified: Secondary | ICD-10-CM | POA: Diagnosis not present

## 2022-12-08 ENCOUNTER — Ambulatory Visit: Payer: Medicare HMO | Attending: Cardiovascular Disease | Admitting: Cardiovascular Disease

## 2022-12-08 ENCOUNTER — Encounter: Payer: Self-pay | Admitting: Cardiovascular Disease

## 2022-12-08 VITALS — BP 128/72 | HR 77 | Ht 69.0 in | Wt 167.0 lb

## 2022-12-08 DIAGNOSIS — E782 Mixed hyperlipidemia: Secondary | ICD-10-CM

## 2022-12-08 DIAGNOSIS — I10 Essential (primary) hypertension: Secondary | ICD-10-CM

## 2022-12-08 DIAGNOSIS — I6522 Occlusion and stenosis of left carotid artery: Secondary | ICD-10-CM

## 2022-12-08 NOTE — Patient Instructions (Signed)
Medication Instructions:  The current medical regimen is effective;  continue present plan and medications.  *If you need a refill on your cardiac medications before your next appointment, please call your pharmacy*   Lab Work: LIPID today   If you have labs (blood work) drawn today and your tests are completely normal, you will receive your results only by: [place] (if you have MyChart) OR A paper copy in the mail If you have any lab test that is abnormal or we need to change your treatment, we will call you to review the results.   Testing/Procedures:  Your physician has requested that you have a carotid duplex (12 month). This test is an ultrasound of the carotid arteries in your neck. It looks at blood flow through these arteries that supply the brain with blood. Allow one hour for this exam. There are no restrictions or special instructions.   Follow-Up: At [facility], you and your health needs are our priority.  As part of our continuing mission to provide you with exceptional heart care, we have created designated Provider Care Teams.  These Care Teams include your primary Cardiologist (physician) and Advanced Practice Providers (APPs -  Physician Assistants and Nurse Practitioners) who all work together to provide you with the care you need, when you need it.  We recommend signing up for the patient portal called "MyChart".  Sign up information is provided on this After Visit Summary.  MyChart is used to connect with patients for Virtual Visits (Telemedicine).  Patients are able to view lab/test results, encounter notes, upcoming appointments, etc.  Non-urgent messages can be sent to your provider as well.   To learn more about what you can do with MyChart, go to [email or web address].    Your next appointment:   12 month(s)  Provider:   Eleonore Chiquito, MD

## 2022-12-09 LAB — LIPID PANEL
Chol/HDL Ratio: 2.9 ratio (ref 0.0–5.0)
Cholesterol, Total: 109 mg/dL (ref 100–199)
HDL: 38 mg/dL — ABNORMAL LOW (ref >39)
LDL Chol Calc (NIH): 47 mg/dL (ref 0–99)
Triglycerides: 135 mg/dL (ref 0–149)
VLDL Cholesterol Cal: 24 mg/dL (ref 5–40)

## 2022-12-12 DIAGNOSIS — M25462 Effusion, left knee: Secondary | ICD-10-CM | POA: Diagnosis not present

## 2022-12-12 DIAGNOSIS — M25662 Stiffness of left knee, not elsewhere classified: Secondary | ICD-10-CM | POA: Diagnosis not present

## 2022-12-12 DIAGNOSIS — M25562 Pain in left knee: Secondary | ICD-10-CM | POA: Diagnosis not present

## 2022-12-12 DIAGNOSIS — R2689 Other abnormalities of gait and mobility: Secondary | ICD-10-CM | POA: Diagnosis not present

## 2022-12-13 DIAGNOSIS — H401131 Primary open-angle glaucoma, bilateral, mild stage: Secondary | ICD-10-CM | POA: Diagnosis not present

## 2022-12-13 DIAGNOSIS — H524 Presbyopia: Secondary | ICD-10-CM | POA: Diagnosis not present

## 2022-12-13 DIAGNOSIS — Z961 Presence of intraocular lens: Secondary | ICD-10-CM | POA: Diagnosis not present

## 2022-12-13 DIAGNOSIS — H59812 Chorioretinal scars after surgery for detachment, left eye: Secondary | ICD-10-CM | POA: Diagnosis not present

## 2022-12-13 DIAGNOSIS — H26491 Other secondary cataract, right eye: Secondary | ICD-10-CM | POA: Diagnosis not present

## 2022-12-14 DIAGNOSIS — Z96652 Presence of left artificial knee joint: Secondary | ICD-10-CM | POA: Diagnosis not present

## 2022-12-14 DIAGNOSIS — M25562 Pain in left knee: Secondary | ICD-10-CM | POA: Diagnosis not present

## 2022-12-14 DIAGNOSIS — M25462 Effusion, left knee: Secondary | ICD-10-CM | POA: Diagnosis not present

## 2022-12-14 DIAGNOSIS — R2689 Other abnormalities of gait and mobility: Secondary | ICD-10-CM | POA: Diagnosis not present

## 2022-12-14 DIAGNOSIS — M25662 Stiffness of left knee, not elsewhere classified: Secondary | ICD-10-CM | POA: Diagnosis not present

## 2022-12-19 DIAGNOSIS — R2689 Other abnormalities of gait and mobility: Secondary | ICD-10-CM | POA: Diagnosis not present

## 2022-12-19 DIAGNOSIS — M25562 Pain in left knee: Secondary | ICD-10-CM | POA: Diagnosis not present

## 2022-12-19 DIAGNOSIS — M25662 Stiffness of left knee, not elsewhere classified: Secondary | ICD-10-CM | POA: Diagnosis not present

## 2022-12-19 DIAGNOSIS — M25462 Effusion, left knee: Secondary | ICD-10-CM | POA: Diagnosis not present

## 2022-12-26 ENCOUNTER — Telehealth: Payer: Self-pay | Admitting: Cardiovascular Disease

## 2022-12-26 DIAGNOSIS — M25562 Pain in left knee: Secondary | ICD-10-CM | POA: Diagnosis not present

## 2022-12-26 DIAGNOSIS — R2689 Other abnormalities of gait and mobility: Secondary | ICD-10-CM | POA: Diagnosis not present

## 2022-12-26 DIAGNOSIS — M25662 Stiffness of left knee, not elsewhere classified: Secondary | ICD-10-CM | POA: Diagnosis not present

## 2022-12-26 DIAGNOSIS — M25462 Effusion, left knee: Secondary | ICD-10-CM | POA: Diagnosis not present

## 2022-12-26 DIAGNOSIS — Z6825 Body mass index (BMI) 25.0-25.9, adult: Secondary | ICD-10-CM | POA: Diagnosis not present

## 2022-12-26 DIAGNOSIS — E1169 Type 2 diabetes mellitus with other specified complication: Secondary | ICD-10-CM | POA: Diagnosis not present

## 2022-12-26 NOTE — Telephone Encounter (Signed)
Linda from Dr. Donita Brooks office (PCP) asked for pt's recent OV be faxed over to their office.   [phone]

## 2022-12-27 DIAGNOSIS — I10 Essential (primary) hypertension: Secondary | ICD-10-CM | POA: Diagnosis not present

## 2022-12-27 DIAGNOSIS — E1169 Type 2 diabetes mellitus with other specified complication: Secondary | ICD-10-CM | POA: Diagnosis not present

## 2022-12-27 DIAGNOSIS — R5383 Other fatigue: Secondary | ICD-10-CM | POA: Diagnosis not present

## 2022-12-27 DIAGNOSIS — Z79899 Other long term (current) drug therapy: Secondary | ICD-10-CM | POA: Diagnosis not present

## 2023-02-19 DIAGNOSIS — E785 Hyperlipidemia, unspecified: Secondary | ICD-10-CM | POA: Diagnosis not present

## 2023-02-19 DIAGNOSIS — I739 Peripheral vascular disease, unspecified: Secondary | ICD-10-CM | POA: Diagnosis not present

## 2023-02-19 DIAGNOSIS — Z79899 Other long term (current) drug therapy: Secondary | ICD-10-CM | POA: Diagnosis not present

## 2023-02-19 DIAGNOSIS — I10 Essential (primary) hypertension: Secondary | ICD-10-CM | POA: Diagnosis not present

## 2023-02-19 DIAGNOSIS — E1169 Type 2 diabetes mellitus with other specified complication: Secondary | ICD-10-CM | POA: Diagnosis not present

## 2023-02-19 DIAGNOSIS — D649 Anemia, unspecified: Secondary | ICD-10-CM | POA: Diagnosis not present

## 2023-02-19 DIAGNOSIS — R29898 Other symptoms and signs involving the musculoskeletal system: Secondary | ICD-10-CM | POA: Diagnosis not present

## 2023-02-19 DIAGNOSIS — Z6825 Body mass index (BMI) 25.0-25.9, adult: Secondary | ICD-10-CM | POA: Diagnosis not present

## 2023-02-22 DIAGNOSIS — Z96652 Presence of left artificial knee joint: Secondary | ICD-10-CM | POA: Diagnosis not present

## 2023-02-23 DIAGNOSIS — I739 Peripheral vascular disease, unspecified: Secondary | ICD-10-CM | POA: Diagnosis not present

## 2023-03-06 ENCOUNTER — Ambulatory Visit: Payer: Medicare HMO | Admitting: Cardiovascular Disease

## 2023-03-12 DIAGNOSIS — D696 Thrombocytopenia, unspecified: Secondary | ICD-10-CM | POA: Diagnosis not present

## 2023-03-28 DIAGNOSIS — R29898 Other symptoms and signs involving the musculoskeletal system: Secondary | ICD-10-CM | POA: Diagnosis not present

## 2023-03-28 DIAGNOSIS — M5136 Other intervertebral disc degeneration, lumbar region: Secondary | ICD-10-CM | POA: Diagnosis not present

## 2023-03-28 DIAGNOSIS — E1169 Type 2 diabetes mellitus with other specified complication: Secondary | ICD-10-CM | POA: Diagnosis not present

## 2023-03-28 DIAGNOSIS — I10 Essential (primary) hypertension: Secondary | ICD-10-CM | POA: Diagnosis not present

## 2023-03-28 DIAGNOSIS — Z6825 Body mass index (BMI) 25.0-25.9, adult: Secondary | ICD-10-CM | POA: Diagnosis not present

## 2023-03-28 DIAGNOSIS — Z79899 Other long term (current) drug therapy: Secondary | ICD-10-CM | POA: Diagnosis not present

## 2023-05-30 ENCOUNTER — Other Ambulatory Visit: Payer: Self-pay | Admitting: Cardiovascular Disease

## 2023-06-05 DIAGNOSIS — E785 Hyperlipidemia, unspecified: Secondary | ICD-10-CM | POA: Diagnosis not present

## 2023-06-05 DIAGNOSIS — Z6825 Body mass index (BMI) 25.0-25.9, adult: Secondary | ICD-10-CM | POA: Diagnosis not present

## 2023-06-05 DIAGNOSIS — E1169 Type 2 diabetes mellitus with other specified complication: Secondary | ICD-10-CM | POA: Diagnosis not present

## 2023-06-05 DIAGNOSIS — M48062 Spinal stenosis, lumbar region with neurogenic claudication: Secondary | ICD-10-CM | POA: Diagnosis not present

## 2023-06-05 DIAGNOSIS — I10 Essential (primary) hypertension: Secondary | ICD-10-CM | POA: Diagnosis not present

## 2023-06-12 DIAGNOSIS — H26491 Other secondary cataract, right eye: Secondary | ICD-10-CM | POA: Diagnosis not present

## 2023-06-12 DIAGNOSIS — H59812 Chorioretinal scars after surgery for detachment, left eye: Secondary | ICD-10-CM | POA: Diagnosis not present

## 2023-06-12 DIAGNOSIS — Z961 Presence of intraocular lens: Secondary | ICD-10-CM | POA: Diagnosis not present

## 2023-06-12 DIAGNOSIS — H401131 Primary open-angle glaucoma, bilateral, mild stage: Secondary | ICD-10-CM | POA: Diagnosis not present

## 2023-07-10 DIAGNOSIS — I10 Essential (primary) hypertension: Secondary | ICD-10-CM | POA: Diagnosis not present

## 2023-07-10 DIAGNOSIS — R29898 Other symptoms and signs involving the musculoskeletal system: Secondary | ICD-10-CM | POA: Diagnosis not present

## 2023-07-10 DIAGNOSIS — Z6825 Body mass index (BMI) 25.0-25.9, adult: Secondary | ICD-10-CM | POA: Diagnosis not present

## 2023-08-02 DIAGNOSIS — J0100 Acute maxillary sinusitis, unspecified: Secondary | ICD-10-CM | POA: Diagnosis not present

## 2023-08-08 DIAGNOSIS — K4030 Unilateral inguinal hernia, with obstruction, without gangrene, not specified as recurrent: Secondary | ICD-10-CM | POA: Diagnosis not present

## 2023-08-08 DIAGNOSIS — E119 Type 2 diabetes mellitus without complications: Secondary | ICD-10-CM | POA: Diagnosis not present

## 2023-08-08 DIAGNOSIS — I10 Essential (primary) hypertension: Secondary | ICD-10-CM | POA: Diagnosis not present

## 2023-08-23 ENCOUNTER — Telehealth: Payer: Self-pay | Admitting: Cardiovascular Disease

## 2023-08-23 NOTE — Telephone Encounter (Signed)
Patient called and canceled Carotid test stating he did not need this test.

## 2023-08-28 DIAGNOSIS — I10 Essential (primary) hypertension: Secondary | ICD-10-CM | POA: Diagnosis not present

## 2023-08-28 DIAGNOSIS — Z6825 Body mass index (BMI) 25.0-25.9, adult: Secondary | ICD-10-CM | POA: Diagnosis not present

## 2023-08-28 DIAGNOSIS — Z1331 Encounter for screening for depression: Secondary | ICD-10-CM | POA: Diagnosis not present

## 2023-08-28 DIAGNOSIS — Z0000 Encounter for general adult medical examination without abnormal findings: Secondary | ICD-10-CM | POA: Diagnosis not present

## 2023-08-28 DIAGNOSIS — K219 Gastro-esophageal reflux disease without esophagitis: Secondary | ICD-10-CM | POA: Diagnosis not present

## 2023-08-28 DIAGNOSIS — E1169 Type 2 diabetes mellitus with other specified complication: Secondary | ICD-10-CM | POA: Diagnosis not present

## 2023-08-28 DIAGNOSIS — E785 Hyperlipidemia, unspecified: Secondary | ICD-10-CM | POA: Diagnosis not present

## 2023-09-07 ENCOUNTER — Encounter (HOSPITAL_COMMUNITY): Payer: Medicare HMO

## 2023-09-07 DIAGNOSIS — I10 Essential (primary) hypertension: Secondary | ICD-10-CM | POA: Diagnosis not present

## 2023-09-07 DIAGNOSIS — E119 Type 2 diabetes mellitus without complications: Secondary | ICD-10-CM | POA: Diagnosis not present

## 2023-09-07 DIAGNOSIS — Z01818 Encounter for other preprocedural examination: Secondary | ICD-10-CM | POA: Diagnosis not present

## 2023-09-07 DIAGNOSIS — K4030 Unilateral inguinal hernia, with obstruction, without gangrene, not specified as recurrent: Secondary | ICD-10-CM | POA: Diagnosis not present

## 2023-09-07 DIAGNOSIS — Z01810 Encounter for preprocedural cardiovascular examination: Secondary | ICD-10-CM | POA: Diagnosis not present

## 2023-09-10 DIAGNOSIS — E1136 Type 2 diabetes mellitus with diabetic cataract: Secondary | ICD-10-CM | POA: Diagnosis not present

## 2023-09-10 DIAGNOSIS — K4030 Unilateral inguinal hernia, with obstruction, without gangrene, not specified as recurrent: Secondary | ICD-10-CM | POA: Diagnosis not present

## 2023-09-20 DIAGNOSIS — H26491 Other secondary cataract, right eye: Secondary | ICD-10-CM | POA: Diagnosis not present

## 2023-09-20 DIAGNOSIS — H401131 Primary open-angle glaucoma, bilateral, mild stage: Secondary | ICD-10-CM | POA: Diagnosis not present

## 2023-09-20 DIAGNOSIS — H59812 Chorioretinal scars after surgery for detachment, left eye: Secondary | ICD-10-CM | POA: Diagnosis not present

## 2023-09-20 DIAGNOSIS — Z961 Presence of intraocular lens: Secondary | ICD-10-CM | POA: Diagnosis not present

## 2023-09-26 DIAGNOSIS — D649 Anemia, unspecified: Secondary | ICD-10-CM | POA: Diagnosis not present

## 2023-11-01 ENCOUNTER — Encounter: Payer: Self-pay | Admitting: *Deleted

## 2023-11-05 ENCOUNTER — Encounter: Payer: Self-pay | Admitting: Diagnostic Neuroimaging

## 2023-11-05 ENCOUNTER — Ambulatory Visit: Payer: Medicare HMO | Admitting: Diagnostic Neuroimaging

## 2023-11-05 VITALS — BP 159/69 | HR 66 | Ht 69.0 in | Wt 173.6 lb

## 2023-11-05 DIAGNOSIS — R29898 Other symptoms and signs involving the musculoskeletal system: Secondary | ICD-10-CM | POA: Diagnosis not present

## 2023-11-05 NOTE — Progress Notes (Signed)
 [facility]  PATIENT: Troy Wu DOB: 1946-04-24  REFERRING CLINICIAN: Philemon Kingdom, MD HISTORY FROM: PATIENT  REASON FOR VISIT: NEW CONSULT   HISTORICAL  CHIEF COMPLAINT:  Chief Complaint  Patient presents with   New Patient (Initial Visit)    Pt in room 6 alone. New patient paper referral for Bilateral leg weakness. Pt said for 2 years he noticed leg weakness, no pain. No falls related to leg weakness.     HISTORY OF PRESENT ILLNESS:   78 year old male here for evaluation of lower extremity weakness.  2023 patient had onset of lower extremity weakness in thighs and legs, especially after walking up steps, inclines or for more than 5 minutes at a time.  No lower extremity pain or numbness.  Has some history of right lumbar radiculopathy, status post surgery in 2019.  No vision changes, slurred speech, trouble swallowing, problems with arms or hands.   REVIEW OF SYSTEMS: Full 14 system review of systems performed and negative with exception of: as per HPI.  ALLERGIES: Allergies  Allergen Reactions   Codeine Nausea Only    Sick on stomach    HOME MEDICATIONS: Outpatient Medications Prior to Visit  Medication Sig Dispense Refill   ALPHA LIPOIC ACID PO Take by mouth.     atorvastatin (LIPITOR) 40 MG tablet TAKE 1 TABLET BY MOUTH EVERY DAY 90 tablet 3   brimonidine (ALPHAGAN) 0.2 % ophthalmic solution INSTILL 1 DROP INTO LEFT EYE EVERY MORNING     doxazosin (CARDURA) 2 MG tablet Take 8 mg by mouth daily.     latanoprost (XALATAN) 0.005 % ophthalmic solution Place 1 drop into both eyes at bedtime.  3   metFORMIN (GLUCOPHAGE-XR) 500 MG 24 hr tablet TAKE 1 TABLET WITH EVENING MEAL EVERY DAY BY MOUTH     omeprazole (PRILOSEC) 20 MG capsule Take 20 mg by mouth daily.  7   tamsulosin (FLOMAX) 0.4 MG CAPS capsule Take 0.4 mg by mouth daily.  3   telmisartan (MICARDIS) 80 MG tablet Take 80 mg by mouth daily.  3   aspirin EC 81 MG tablet Take 1  tablet (81 mg total) by mouth daily. Swallow whole. (Patient not taking: Reported on 11/05/2023) 90 tablet 3   No facility-administered medications prior to visit.    PAST MEDICAL HISTORY: Past Medical History:  Diagnosis Date   Arthritis    Detached retina, left    x2   Diabetes mellitus without complication (HCC)    GERD (gastroesophageal reflux disease)    Hard of hearing    wears bilateral hearing aids   Hypertension    PONV (postoperative nausea and vomiting)    Prostate cancer (HCC)    Sleep apnea    does not use CPAP    PAST SURGICAL HISTORY: Past Surgical History:  Procedure Laterality Date   CATARACT EXTRACTION, BILATERAL     CHOLECYSTECTOMY     EYE SURGERY     HERNIA REPAIR     LUMBAR LAMINECTOMY/DECOMPRESSION MICRODISCECTOMY Right 08/14/2018   Procedure: Right L4-5 decompression/disectomy;  Surgeon: Venita Lick, MD;  Location: [facility] OR;  Service: Orthopedics;  Laterality: Right;  2.5 hrs   RETINAL DETACHMENT SURGERY     VASECTOMY      FAMILY HISTORY: Family History  Problem Relation Age of Onset   Hypertension Mother    Hypertension Father    Dementia Sister     SOCIAL HISTORY: Social History   Socioeconomic History   Marital status: Married  Spouse name: Not on file   Number of children: 4   Years of education: Not on file   Highest education level: Not on file  Occupational History   Occupation: Carpenter  Tobacco Use   Smoking status: Former    Current packs/day: 0.00    Average packs/day: 2.0 packs/day for 30.0 years (60.0 ttl pk-yrs)    Types: Cigarettes    Start date: 07/19/1968    Quit date: 07/19/1998    Years since quitting: 25.3   Smokeless tobacco: Never  Vaping Use   Vaping status: Never Used  Substance and Sexual Activity   Alcohol use: Never   Drug use: Never   Sexual activity: Not on file  Other Topics Concern   Not on file  Social History Narrative   Right handed    Wear glasses    Drink coffee 3-4 per day           Pt lives with wife and works part-time.   Social Drivers of Corporate investment banker Strain: Not on file  Food Insecurity: Not on file  Transportation Needs: Not on file  Physical Activity: Not on file  Stress: Not on file  Social Connections: Not on file  Intimate Partner Violence: Not on file     PHYSICAL EXAM  GENERAL EXAM/CONSTITUTIONAL: Vitals:  Vitals:   11/05/23 1114  BP: (!) 159/69  Pulse: 66  Weight: 173 lb 9.6 oz (78.7 kg)  Height: 5\' 9"  (1.753 m)   Body mass index is 25.64 kg/m. Wt Readings from Last 3 Encounters:  11/05/23 173 lb 9.6 oz (78.7 kg)  12/08/22 167 lb (75.8 kg)  08/31/22 173 lb 3.2 oz (78.6 kg)   Patient is in no distress; well developed, nourished and groomed; neck is supple  CARDIOVASCULAR: Examination of carotid arteries is normal; no carotid bruits Regular rate and rhythm, no murmurs Examination of peripheral vascular system by observation and palpation is normal  EYES: Ophthalmoscopic exam of optic discs and posterior segments is normal; no papilledema or hemorrhages No results found.  MUSCULOSKELETAL: Gait, strength, tone, movements noted in Neurologic exam below  NEUROLOGIC: MENTAL STATUS:      No data to display         awake, alert, oriented to person, place and time recent and remote memory intact normal attention and concentration language fluent, comprehension intact, naming intact fund of knowledge appropriate  CRANIAL NERVE:  2nd - no papilledema on fundoscopic exam 2nd, 3rd, 4th, 6th - RIGHT PUPIL IRREGULAR AND REACTIVE; LEFT PUPIL ROUND AND MIN REACTIVE, visual fields full to confrontation, extraocular muscles intact, no nystagmus 5th - facial sensation symmetric 7th - facial strength symmetric 8th - hearing intact 9th - palate elevates symmetrically, uvula midline 11th - shoulder shrug symmetric 12th - tongue protrusion midline  MOTOR:  normal bulk and tone, full strength in the BUE,  BLE  SENSORY:  normal and symmetric to light touch, temperature, vibration; EXCEPT DECR IN ANKLES AND FEET  COORDINATION:  finger-nose-finger, fine finger movements normal  REFLEXES:  deep tendon reflexes TRACE and symmetric  GAIT/STATION:  narrow based gait; ABLE TO WALK TOES, HEELS AND TANDEM     DIAGNOSTIC DATA (LABS, IMAGING, TESTING) - I reviewed patient records, labs, notes, testing and imaging myself where available.  Lab Results  Component Value Date   WBC 8.0 08/06/2018   HGB 14.1 08/06/2018   HCT 42.8 08/06/2018   MCV 94.5 08/06/2018   PLT 192 08/06/2018  Component Value Date/Time   NA 136 08/06/2018 1050   K 4.1 08/06/2018 1050   CL 109 08/06/2018 1050   CO2 19 (L) 08/06/2018 1050   GLUCOSE 147 (H) 08/06/2018 1050   BUN 22 08/06/2018 1050   CREATININE 0.97 08/06/2018 1050   CALCIUM 9.5 08/06/2018 1050   GFRNONAA >60 08/06/2018 1050   GFRAA >60 08/06/2018 1050   Lab Results  Component Value Date   CHOL 109 12/08/2022   HDL 38 (L) 12/08/2022   LDLCALC 47 12/08/2022   TRIG 135 12/08/2022   CHOLHDL 2.9 12/08/2022   No results found for: "HGBA1C" No results found for: "VITAMINB12" No results found for: "TSH"  08/14/18 xray lumbar spine - Localization radiographs with positioning of the localization needles and probes as described. The findings were called to the operating room and the report given to [facility] at 8:41 a.m. on 14 August 2018.    ASSESSMENT AND PLAN  78 y.o. year old male here with:   Dx:  1. Leg weakness, bilateral      PLAN:  BILATERAL LEG WEAKNESS (since ~2023; with standing and walking x 5 minutes or longer; right hip pain; history of L4-5 low back surgery in 2019) - check MRI lumbar spine (rule out spinal stenosis) - check labs (neuromuscular eval)  Orders Placed This Encounter  Procedures   MR Lumbar Spine W Wo Contrast   CK   Aldolase   AChR Abs with Reflex to MuSK   Vitamin B12   Hemoglobin A1c   TSH  Rfx on Abnormal to Free T4   Return for pending if symptoms worsen or fail to improve, pending test results.    Suanne Marker, MD 11/05/2023, 11:36 AM Certified in Neurology, Neurophysiology and Neuroimaging  [facility] [address] [place], [place] [address] [phone]

## 2023-11-05 NOTE — Patient Instructions (Signed)
 BILATERAL LEG WEAKNESS (with standing and walking x 5 minutes or longer; right hip pain; history of low back surgery in 2019) - check MRI lumbar spine (rule out spinal stenosis) - check labs (neuromuscular eval)

## 2023-11-08 ENCOUNTER — Telehealth: Payer: Self-pay | Admitting: Diagnostic Neuroimaging

## 2023-11-08 DIAGNOSIS — G8929 Other chronic pain: Secondary | ICD-10-CM

## 2023-11-08 DIAGNOSIS — R29898 Other symptoms and signs involving the musculoskeletal system: Secondary | ICD-10-CM

## 2023-11-08 NOTE — Telephone Encounter (Signed)
 sent to GI they obtain [facility] Berkley Harvey [phone]

## 2023-11-15 DIAGNOSIS — Z96652 Presence of left artificial knee joint: Secondary | ICD-10-CM | POA: Diagnosis not present

## 2023-11-16 LAB — CK: Total CK: 72 U/L (ref 41–331)

## 2023-11-16 LAB — VITAMIN B12: Vitamin B-12: 590 pg/mL (ref 232–1245)

## 2023-11-16 LAB — HEMOGLOBIN A1C
Est. average glucose Bld gHb Est-mCnc: 171 mg/dL
Hgb A1c MFr Bld: 7.6 % — ABNORMAL HIGH (ref 4.8–5.6)

## 2023-11-16 LAB — TSH RFX ON ABNORMAL TO FREE T4: TSH: 1.82 u[IU]/mL (ref 0.450–4.500)

## 2023-11-16 LAB — ALDOLASE: Aldolase: 6.2 U/L (ref 3.3–10.3)

## 2023-11-16 LAB — MUSK ANTIBODIES: MuSK Antibodies: <1 U/mL

## 2023-11-16 LAB — ACHR ABS WITH REFLEX TO MUSK: AChR Binding Ab, Serum: <0.03 nmol/L (ref 0.00–0.24)

## 2023-11-19 ENCOUNTER — Telehealth: Payer: Self-pay | Admitting: *Deleted

## 2023-11-19 NOTE — Telephone Encounter (Signed)
-----   Message from [facility] sent at 11/18/2023 11:19 PM EST ----- A1c 7.6; continue diabetes treatments. Other labs ok. -VRP

## 2023-11-19 NOTE — Telephone Encounter (Signed)
 Left message for patient to call.

## 2023-11-26 DIAGNOSIS — K219 Gastro-esophageal reflux disease without esophagitis: Secondary | ICD-10-CM | POA: Diagnosis not present

## 2023-11-26 DIAGNOSIS — I10 Essential (primary) hypertension: Secondary | ICD-10-CM | POA: Diagnosis not present

## 2023-11-26 DIAGNOSIS — E785 Hyperlipidemia, unspecified: Secondary | ICD-10-CM | POA: Diagnosis not present

## 2023-11-26 DIAGNOSIS — E1169 Type 2 diabetes mellitus with other specified complication: Secondary | ICD-10-CM | POA: Diagnosis not present

## 2023-11-26 DIAGNOSIS — Z6826 Body mass index (BMI) 26.0-26.9, adult: Secondary | ICD-10-CM | POA: Diagnosis not present

## 2023-12-11 ENCOUNTER — Encounter: Payer: Self-pay | Admitting: Diagnostic Neuroimaging

## 2023-12-14 ENCOUNTER — Other Ambulatory Visit: Payer: Medicare HMO

## 2023-12-19 DIAGNOSIS — H59812 Chorioretinal scars after surgery for detachment, left eye: Secondary | ICD-10-CM | POA: Diagnosis not present

## 2023-12-19 DIAGNOSIS — Z961 Presence of intraocular lens: Secondary | ICD-10-CM | POA: Diagnosis not present

## 2023-12-19 DIAGNOSIS — H524 Presbyopia: Secondary | ICD-10-CM | POA: Diagnosis not present

## 2023-12-19 DIAGNOSIS — H26491 Other secondary cataract, right eye: Secondary | ICD-10-CM | POA: Diagnosis not present

## 2023-12-19 DIAGNOSIS — H401131 Primary open-angle glaucoma, bilateral, mild stage: Secondary | ICD-10-CM | POA: Diagnosis not present

## 2023-12-24 NOTE — Addendum Note (Signed)
 Addended by: Joycelyn Schmid R on: 12/24/2023 05:24 PM   Modules accepted: Orders

## 2023-12-24 NOTE — Telephone Encounter (Signed)
 I received a fax from [facility] that the MRI lumbar was denied because they are requiring 6 weeks of provider directed treatment to be completed in the last 3 months without improved symptoms. Symptoms must be the same or worse after treatment to support imaging.

## 2023-12-24 NOTE — Telephone Encounter (Signed)
 Will order PT. Then after 6 weeks, may try to get MRI lumbar spine. _VRP

## 2023-12-28 DIAGNOSIS — J3489 Other specified disorders of nose and nasal sinuses: Secondary | ICD-10-CM | POA: Diagnosis not present

## 2023-12-28 DIAGNOSIS — R051 Acute cough: Secondary | ICD-10-CM | POA: Diagnosis not present

## 2023-12-28 DIAGNOSIS — R070 Pain in throat: Secondary | ICD-10-CM | POA: Diagnosis not present

## 2023-12-28 DIAGNOSIS — R0981 Nasal congestion: Secondary | ICD-10-CM | POA: Diagnosis not present

## 2023-12-28 DIAGNOSIS — J309 Allergic rhinitis, unspecified: Secondary | ICD-10-CM | POA: Diagnosis not present

## 2024-01-01 ENCOUNTER — Other Ambulatory Visit: Payer: Self-pay | Admitting: Diagnostic Neuroimaging

## 2024-01-09 DIAGNOSIS — Z79899 Other long term (current) drug therapy: Secondary | ICD-10-CM | POA: Diagnosis not present

## 2024-01-09 DIAGNOSIS — I10 Essential (primary) hypertension: Secondary | ICD-10-CM | POA: Diagnosis not present

## 2024-01-09 DIAGNOSIS — Z6825 Body mass index (BMI) 25.0-25.9, adult: Secondary | ICD-10-CM | POA: Diagnosis not present

## 2024-01-09 DIAGNOSIS — E1169 Type 2 diabetes mellitus with other specified complication: Secondary | ICD-10-CM | POA: Diagnosis not present

## 2024-01-09 DIAGNOSIS — H401130 Primary open-angle glaucoma, bilateral, stage unspecified: Secondary | ICD-10-CM | POA: Diagnosis not present

## 2024-01-09 DIAGNOSIS — E785 Hyperlipidemia, unspecified: Secondary | ICD-10-CM | POA: Diagnosis not present

## 2024-04-02 ENCOUNTER — Encounter (HOSPITAL_BASED_OUTPATIENT_CLINIC_OR_DEPARTMENT_OTHER): Payer: Self-pay | Admitting: Family Medicine

## 2024-04-02 ENCOUNTER — Encounter (HOSPITAL_BASED_OUTPATIENT_CLINIC_OR_DEPARTMENT_OTHER): Payer: Self-pay

## 2024-04-02 ENCOUNTER — Ambulatory Visit (INDEPENDENT_AMBULATORY_CARE_PROVIDER_SITE_OTHER): Admitting: Family Medicine

## 2024-04-02 VITALS — BP 155/72 | HR 68 | Temp 98.4°F | Resp 17 | Ht 68.5 in | Wt 169.7 lb

## 2024-04-02 DIAGNOSIS — R809 Proteinuria, unspecified: Secondary | ICD-10-CM | POA: Diagnosis not present

## 2024-04-02 DIAGNOSIS — L259 Unspecified contact dermatitis, unspecified cause: Secondary | ICD-10-CM | POA: Diagnosis not present

## 2024-04-02 DIAGNOSIS — I10 Essential (primary) hypertension: Secondary | ICD-10-CM | POA: Diagnosis not present

## 2024-04-02 DIAGNOSIS — E1129 Type 2 diabetes mellitus with other diabetic kidney complication: Secondary | ICD-10-CM | POA: Diagnosis not present

## 2024-04-02 DIAGNOSIS — R600 Localized edema: Secondary | ICD-10-CM | POA: Insufficient documentation

## 2024-04-02 DIAGNOSIS — E7800 Pure hypercholesterolemia, unspecified: Secondary | ICD-10-CM

## 2024-04-02 DIAGNOSIS — N400 Enlarged prostate without lower urinary tract symptoms: Secondary | ICD-10-CM | POA: Diagnosis not present

## 2024-04-02 MED ORDER — GLIMEPIRIDE 2 MG PO TABS: 2.0000 mg | ORAL_TABLET | Freq: Every day | ORAL | 0 refills | Status: DC

## 2024-04-02 MED ORDER — TRIAMCINOLONE ACETONIDE 40 MG/ML IJ SUSP: 40.0000 mg | Freq: Once | INTRAMUSCULAR | Status: DC

## 2024-04-02 NOTE — Progress Notes (Unsigned)
 Established Patient Office Visit  Subjective   Patient ID: Troy Wu, male    DOB: 09-01-1946  Age: 78 y.o. MRN: [id number]  Chief Complaint  Patient presents with   Establish Care    Establish care    F/u as above.  New to my practice.  He is frustrated with bilateral leg discomfort and swelling.  Right leg is worse.  Has also been struggling with an itchy rash.  Has recently seen by Dr. Jefferey.  We have very limited records on those recent visits.  Some concern that his rash might be medication related.  He is a challenging historian and can't be sure what medication might be causative.  His leg swelling has worsened some.  His fasting sugar this morning was about 170.  No SOB.  He does struggle with urinary frequency, especially at night.  Hasn't seen Urology in years.    Past Medical History:  Diagnosis Date   Detached retina, left    x2   Diabetes mellitus with microalbuminuria (HCC)    Dyslipidemia    Former smoker    GERD (gastroesophageal reflux disease)    Glaucoma    f/by eye doctor   Hard of hearing    wears bilateral hearing aids   Hypertension    Prostate cancer (HCC)    Distant past.  Known to Urology in [place] with rx declined   Sleep apnea    does not use CPAP    Outpatient Encounter Medications as of 04/02/2024  Medication Sig   amLODipine  (NORVASC ) 5 MG tablet Take 5 mg by mouth daily.   brimonidine (ALPHAGAN) 0.2 % ophthalmic solution INSTILL 1 DROP INTO LEFT EYE EVERY MORNING   glimepiride  (AMARYL ) 2 MG tablet Take 1 tablet (2 mg total) by mouth daily before breakfast.   latanoprost (XALATAN) 0.005 % ophthalmic solution Place 1 drop into both eyes at bedtime.   lisinopril (ZESTRIL) 10 MG tablet Take 10 mg by mouth daily.   metFORMIN (GLUCOPHAGE-XR) 500 MG 24 hr tablet TAKE 1 TABLET WITH EVENING MEAL EVERY DAY BY MOUTH   omeprazole (PRILOSEC) 20 MG capsule Take 20 mg by mouth daily.   tamsulosin  (FLOMAX ) 0.4 MG CAPS capsule Take 0.4 mg by mouth  daily.   [DISCONTINUED] doxazosin (CARDURA) 2 MG tablet Take 8 mg by mouth daily.   [DISCONTINUED] latanoprost (XALATAN) 0.005 % ophthalmic solution Apply 1 drop to eye.   [DISCONTINUED] telmisartan (MICARDIS) 80 MG tablet Take 1 tablet by mouth daily.   ALPHA LIPOIC ACID PO Take by mouth. (Patient not taking: Reported on 04/02/2024)   aspirin  EC 81 MG tablet Take 1 tablet (81 mg total) by mouth daily. Swallow whole. (Patient not taking: Reported on 04/02/2024)   atorvastatin  (LIPITOR) 40 MG tablet TAKE 1 TABLET BY MOUTH EVERY DAY (Patient not taking: Reported on 04/02/2024)   atorvastatin  (LIPITOR) 40 MG tablet Take 40 mg by mouth.   doxazosin (CARDURA) 8 MG tablet Take 8 mg by mouth daily.   metFORMIN (GLUCOPHAGE) 500 MG tablet Take 500 mg by mouth.   telmisartan (MICARDIS) 80 MG tablet Take 80 mg by mouth daily. (Patient not taking: Reported on 04/02/2024)   Facility-Administered Encounter Medications as of 04/02/2024  Medication   triamcinolone  acetonide (KENALOG -40) injection 40 mg    Social History   Tobacco Use   Smoking status: Former    Current packs/day: 0.00    Average packs/day: 2.0 packs/day for 30.0 years (60.0 ttl pk-yrs)    Types: Cigarettes  Start date: 07/19/1968    Quit date: 07/19/1998    Years since quitting: 25.7   Smokeless tobacco: Never  Vaping Use   Vaping status: Never Used  Substance Use Topics   Alcohol  use: Never   Drug use: Never    {History (Optional):23778}  Review of Systems  Constitutional:  Negative for diaphoresis, fever, malaise/fatigue and weight loss.  Respiratory:  Negative for cough, shortness of breath and wheezing.   Cardiovascular:  Positive for leg swelling. Negative for chest pain, palpitations, orthopnea, claudication and PND.  Genitourinary:  Positive for frequency. Negative for dysuria and hematuria.  Skin:  Positive for itching and rash.      Objective:     BP (!) 155/72 (BP Location: Right Arm, Patient Position: Standing,  Cuff Size: Normal)   Pulse 68   Temp 98.4 F (36.9 C) (Oral)   Resp 17   Ht 5' 8.5 (1.74 m)   Wt 169 lb 11.2 oz (77 kg)   SpO2 95%   BMI 25.42 kg/m  {Vitals History (Optional):23777}  Physical Exam Constitutional:      General: He is not in acute distress.    Appearance: Normal appearance.     Comments: Comfortable, nondyspneic.  HENT:     Head: Normocephalic.  Neck:     Vascular: No carotid bruit.  Cardiovascular:     Rate and Rhythm: Normal rate and regular rhythm.     Pulses: Normal pulses.     Heart sounds: Normal heart sounds.  Pulmonary:     Effort: Pulmonary effort is normal.     Breath sounds: Normal breath sounds.  Abdominal:     General: Bowel sounds are normal.     Palpations: Abdomen is soft.  Musculoskeletal:     Cervical back: Neck supple. No tenderness.     Right lower leg: Edema present.     Left lower leg: Edema present.     Comments: Right leg with 2+ edema.  Left leg with 1+ edema.  Chronic asymmetry reported but overall edema has worsened.  Skin:    Comments: Fine, subtle, erythematous macular exanthem somewhat diffusely across trunk and legs.  Neurological:     Mental Status: He is alert.      No results found for any visits on 04/02/24.  {Labs (Optional):23779}  The ASCVD Risk score (Arnett DK, et al., 2019) failed to calculate for the following reasons:   The valid total cholesterol range is 130 to 320 mg/dL    Assessment & Plan:  Primary hypertension Assessment & Plan: Increase Lisinopril to 20mg  daily for now.  Continue other HTN meds for now.   Hypercholesterolemia  Contact dermatitis, unspecified contact dermatitis type, unspecified trigger -     Triamcinolone  Acetonide  Bilateral leg edema -     US  Venous Img Lower Bilateral (DVT); Future  Diabetes mellitus with microalbuminuria (HCC) -     Glimepiride ; Take 1 tablet (2 mg total) by mouth daily before breakfast.  Dispense: 30 tablet; Refill: 0  Prostatic  hypertrophy    Return in about 1 week (around 04/09/2024) for chronic follow-up.    REDDING PONCE NORLEEN FALCON., MD

## 2024-04-02 NOTE — Assessment & Plan Note (Addendum)
 Increase Lisinopril to 20mg  daily for now.  Continue other HTN meds for now.

## 2024-04-03 MED ORDER — DUTASTERIDE 0.5 MG PO CAPS: 0.5000 mg | ORAL_CAPSULE | Freq: Every day | ORAL | 0 refills | Status: DC

## 2024-04-04 ENCOUNTER — Other Ambulatory Visit (HOSPITAL_BASED_OUTPATIENT_CLINIC_OR_DEPARTMENT_OTHER): Payer: Self-pay | Admitting: Family Medicine

## 2024-04-04 DIAGNOSIS — R21 Rash and other nonspecific skin eruption: Secondary | ICD-10-CM

## 2024-04-04 MED ORDER — METHYLPREDNISOLONE 4 MG PO TBPK: ORAL_TABLET | ORAL | 0 refills | Status: DC

## 2024-04-04 NOTE — Progress Notes (Signed)
  Called patient on 04/03/2024 to check to see how they were feeling. The pt. Stated that the rash was going away and he felt pretty good and in good understanding. The provider prescribed 40mg  of triamcinolone  acetone injectable suspension (1cc). Patient received Ketorolac tromethamine 30mg  (1cc) to the patient in the Rt. Deltoid.

## 2024-04-07 ENCOUNTER — Telehealth (HOSPITAL_BASED_OUTPATIENT_CLINIC_OR_DEPARTMENT_OTHER): Payer: Self-pay | Admitting: Family Medicine

## 2024-04-07 ENCOUNTER — Ambulatory Visit (INDEPENDENT_AMBULATORY_CARE_PROVIDER_SITE_OTHER)
Admission: RE | Admit: 2024-04-07 | Discharge: 2024-04-07 | Disposition: A | Discharge status: Home / Self Care | Source: Ambulatory Visit | Attending: Family Medicine | Admitting: Family Medicine

## 2024-04-07 ENCOUNTER — Ambulatory Visit (HOSPITAL_BASED_OUTPATIENT_CLINIC_OR_DEPARTMENT_OTHER): Payer: Self-pay | Admitting: Family Medicine

## 2024-04-07 DIAGNOSIS — R600 Localized edema: Secondary | ICD-10-CM | POA: Diagnosis not present

## 2024-04-07 NOTE — Telephone Encounter (Signed)
 Followed up with Troy Wu from the injection he received on 04/02/24. Patient is doing well with no issues or reactions. He also picked up his prescription (Medrol ) that was prescribed after the follow up with Dr. Dottie. Patient states is doing well and will follow up with Dr. Dottie 04/08/24 @ 8:50AM in office.

## 2024-04-07 NOTE — Telephone Encounter (Signed)
 Patient called as noted above.  Doing fine after the Toradol injection, though his contact dermatitis was a little worse.  Medrol  sent in.

## 2024-04-08 ENCOUNTER — Encounter (HOSPITAL_BASED_OUTPATIENT_CLINIC_OR_DEPARTMENT_OTHER): Payer: Self-pay | Admitting: Family Medicine

## 2024-04-08 ENCOUNTER — Ambulatory Visit (HOSPITAL_BASED_OUTPATIENT_CLINIC_OR_DEPARTMENT_OTHER): Admitting: Family Medicine

## 2024-04-08 VITALS — BP 156/69 | HR 63 | Temp 98.2°F | Resp 16 | Ht 68.5 in | Wt 170.7 lb

## 2024-04-08 DIAGNOSIS — I10 Essential (primary) hypertension: Secondary | ICD-10-CM

## 2024-04-08 DIAGNOSIS — N400 Enlarged prostate without lower urinary tract symptoms: Secondary | ICD-10-CM

## 2024-04-08 DIAGNOSIS — E7800 Pure hypercholesterolemia, unspecified: Secondary | ICD-10-CM

## 2024-04-08 DIAGNOSIS — E118 Type 2 diabetes mellitus with unspecified complications: Secondary | ICD-10-CM | POA: Insufficient documentation

## 2024-04-08 MED ORDER — EMPAGLIFLOZIN 10 MG PO TABS
10.0000 mg | ORAL_TABLET | Freq: Every day | ORAL | 2 refills | Status: DC
  Filled 2024-05-13 – 2024-06-02 (×2): qty 30, 30d supply, fill #0

## 2024-04-08 NOTE — Progress Notes (Signed)
 Established Patient Office Visit  Subjective   Patient ID: Troy Wu, male    DOB: [birth year]  Age: 78 y.o. MRN: [id number]  Chief Complaint  Patient presents with   Follow-up    Follow-up    F/u as above.  Please see recent notes for details.  His rash and his leg swelling have resolved nicely with the recent Medrol  dosepack.  He feels better today with fewer concerns.  Home BP has improved after I increased his Lisinopril but still running a little high with SBP in the 150 range.  He tried to limit his salt.  Recent sugars look good, but we need to consider adjusting his meds to maximize benefits.    Past Medical History:  Diagnosis Date   Detached retina, left    x2   Diabetes mellitus with microalbuminuria (HCC)    Dyslipidemia    Former smoker    GERD (gastroesophageal reflux disease)    Glaucoma    f/by eye doctor   Hard of hearing    wears bilateral hearing aids   Hypertension    Prostate cancer (HCC)    Distant past.  Known to Urology in [place] with rx declined   Sleep apnea    does not use CPAP    Outpatient Encounter Medications as of 04/08/2024  Medication Sig   amLODipine  (NORVASC ) 5 MG tablet Take 5 mg by mouth daily.   atorvastatin  (LIPITOR) 40 MG tablet Take 40 mg by mouth.   brimonidine (ALPHAGAN) 0.2 % ophthalmic solution INSTILL 1 DROP INTO LEFT EYE EVERY MORNING   doxazosin (CARDURA) 8 MG tablet Take 8 mg by mouth daily.   dutasteride  (AVODART ) 0.5 MG capsule Take 1 capsule (0.5 mg total) by mouth daily.   empagliflozin  (JARDIANCE ) 10 MG TABS tablet Take 1 tablet (10 mg total) by mouth daily before breakfast.   glimepiride  (AMARYL ) 2 MG tablet Take 1 tablet (2 mg total) by mouth daily before breakfast.   latanoprost (XALATAN) 0.005 % ophthalmic solution Place 1 drop into both eyes at bedtime.   lisinopril (ZESTRIL) 20 MG tablet Take 20 mg by mouth daily.   metFORMIN (GLUCOPHAGE-XR) 500 MG 24 hr tablet TAKE 1 TABLET WITH EVENING MEAL EVERY DAY  BY MOUTH   methylPREDNISolone  (MEDROL  DOSEPAK) 4 MG TBPK tablet 6 day dosepack as directed   omeprazole (PRILOSEC) 20 MG capsule Take 20 mg by mouth daily.   tamsulosin  (FLOMAX ) 0.4 MG CAPS capsule Take 0.4 mg by mouth daily.   [DISCONTINUED] metFORMIN (GLUCOPHAGE) 500 MG tablet Take 500 mg by mouth.   ALPHA LIPOIC ACID PO Take by mouth. (Patient not taking: Reported on 04/02/2024)   aspirin  EC 81 MG tablet Take 1 tablet (81 mg total) by mouth daily. Swallow whole. (Patient not taking: Reported on 04/02/2024)   atorvastatin  (LIPITOR) 40 MG tablet TAKE 1 TABLET BY MOUTH EVERY DAY (Patient not taking: Reported on 04/02/2024)   [DISCONTINUED] telmisartan (MICARDIS) 80 MG tablet Take 80 mg by mouth daily. (Patient not taking: Reported on 04/02/2024)   No facility-administered encounter medications on file as of 04/08/2024.    Social History   Tobacco Use   Smoking status: Former    Current packs/day: 0.00    Average packs/day: 2.0 packs/day for 30.0 years (60.0 ttl pk-yrs)    Types: Cigarettes    Start date: 07/19/1968    Quit date: 07/19/1998    Years since quitting: 25.7   Smokeless tobacco: Never  Vaping Use   Vaping status: Never  Used  Substance Use Topics   Alcohol  use: Never   Drug use: Never      Review of Systems  Constitutional:  Negative for diaphoresis, fever, malaise/fatigue and weight loss.  Respiratory:  Negative for cough, shortness of breath and wheezing.   Cardiovascular:  Negative for chest pain, palpitations, orthopnea, claudication, leg swelling and PND.      Objective:     BP (!) 156/69 (BP Location: Right Arm, Patient Position: Standing, Cuff Size: Normal)   Pulse 63   Temp 98.2 F (36.8 C) (Oral)   Resp 16   Ht 5' 8.5 (1.74 m)   Wt 170 lb 11.2 oz (77.4 kg)   SpO2 96%   BMI 25.58 kg/m    Physical Exam Constitutional:      General: He is not in acute distress.    Appearance: Normal appearance.  HENT:     Head: Normocephalic.  Neck:      Vascular: No carotid bruit.  Cardiovascular:     Rate and Rhythm: Normal rate and regular rhythm.     Pulses: Normal pulses.     Heart sounds: Normal heart sounds.  Pulmonary:     Effort: Pulmonary effort is normal.     Breath sounds: Normal breath sounds.  Abdominal:     General: Bowel sounds are normal.     Palpations: Abdomen is soft.  Musculoskeletal:     Cervical back: Neck supple. No tenderness.     Right lower leg: No edema.     Left lower leg: No edema.  Neurological:     Mental Status: He is alert.      No results found for any visits on 04/08/24.    The ASCVD Risk score (Arnett DK, et al., 2019) failed to calculate for the following reasons:   The valid total cholesterol range is 130 to 320 mg/dL    Assessment & Plan:  Type 2 diabetes mellitus with complications (HCC) Assessment & Plan: Overall clearly improved.  Recent fasting sugar looks good after the addition of the Glimepiride .  We had a thorough discussion about the more reputable SGLT-2 class in detailed, lay terms today.  He will update me soon on whether its affordable or not.  May need to increase his Lisinopril dosage again fairly soon.  Again request old records from Dr. Ardis office.  Defer labs until he is fasting next month.  Will also check his skin and his feet at the next visit.  Orders: -     Empagliflozin ; Take 1 tablet (10 mg total) by mouth daily before breakfast.  Dispense: 30 tablet; Refill: 2  Hypercholesterolemia  Primary hypertension  Prostate hypertrophy    Return in about 1 month (around 05/09/2024) for chronic follow-up.    REDDING PONCE NORLEEN FALCON., MD

## 2024-04-08 NOTE — Assessment & Plan Note (Addendum)
 Overall clearly improved.  Recent fasting sugar looks good after the addition of the Glimepiride .  We had a thorough discussion about the more reputable SGLT-2 class in detailed, lay terms today.  He will update me soon on whether its affordable or not.  May need to increase his Lisinopril dosage again fairly soon.  Again request old records from Dr. Ardis office.  Defer labs until he is fasting next month.  Will also check his skin and his feet at the next visit.

## 2024-04-14 ENCOUNTER — Telehealth (HOSPITAL_BASED_OUTPATIENT_CLINIC_OR_DEPARTMENT_OTHER): Payer: Self-pay | Admitting: Family Medicine

## 2024-04-14 NOTE — Telephone Encounter (Signed)
 Copied from CRM [id number]. Topic: General - Other >> Apr 14, 2024  8:34 AM Charlet HERO wrote: Reason for CRM: patient is calling to provide B/P reading for last 5 days low 149 and of 157 and 77/66 for his bottom number.

## 2024-04-25 ENCOUNTER — Other Ambulatory Visit (HOSPITAL_BASED_OUTPATIENT_CLINIC_OR_DEPARTMENT_OTHER): Payer: Self-pay | Admitting: Family Medicine

## 2024-04-25 DIAGNOSIS — N400 Enlarged prostate without lower urinary tract symptoms: Secondary | ICD-10-CM

## 2024-04-25 DIAGNOSIS — E1129 Type 2 diabetes mellitus with other diabetic kidney complication: Secondary | ICD-10-CM

## 2024-05-07 ENCOUNTER — Other Ambulatory Visit (HOSPITAL_BASED_OUTPATIENT_CLINIC_OR_DEPARTMENT_OTHER): Payer: Self-pay | Admitting: Family Medicine

## 2024-05-07 DIAGNOSIS — E1129 Type 2 diabetes mellitus with other diabetic kidney complication: Secondary | ICD-10-CM

## 2024-05-07 NOTE — Telephone Encounter (Unsigned)
 Copied from CRM [id number]. Topic: Clinical - Medication Refill >> May 07, 2024  8:21 AM Willma R wrote: Medication: lisinopril  (ZESTRIL ) 20 MG tablet (says Dr told him to take 30MG ) glimepiride  (AMARYL ) 2 MG tablet  Has the patient contacted their pharmacy? Yes, call Dr  This is the patient's preferred pharmacy:  [id number] - [place], [place] - [address] AT [facility] [address] [place] [address] Phone: [phone] Fax: [phone]  Is this the correct pharmacy for this prescription? Yes If no, delete pharmacy and type the correct one.   Has the prescription been filled recently? No  Is the patient out of the medication? Yes  Has the patient been seen for an appointment in the last year OR does the patient have an upcoming appointment? Yes  Can we respond through MyChart? No  Agent: Please be advised that Rx refills may take up to 3 business days. We ask that you follow-up with your pharmacy.

## 2024-05-09 ENCOUNTER — Ambulatory Visit (HOSPITAL_BASED_OUTPATIENT_CLINIC_OR_DEPARTMENT_OTHER): Admitting: Family Medicine

## 2024-05-09 ENCOUNTER — Other Ambulatory Visit (HOSPITAL_BASED_OUTPATIENT_CLINIC_OR_DEPARTMENT_OTHER): Payer: Self-pay | Admitting: Family Medicine

## 2024-05-09 DIAGNOSIS — I10 Essential (primary) hypertension: Secondary | ICD-10-CM

## 2024-05-09 DIAGNOSIS — E1129 Type 2 diabetes mellitus with other diabetic kidney complication: Secondary | ICD-10-CM

## 2024-05-09 MED ORDER — GLIMEPIRIDE 2 MG PO TABS: 2.0000 mg | ORAL_TABLET | Freq: Every day | ORAL | 0 refills | Status: DC

## 2024-05-09 MED ORDER — LISINOPRIL 20 MG PO TABS
20.0000 mg | ORAL_TABLET | Freq: Every day | ORAL | 1 refills | Status: DC
  Filled 2024-05-13 – 2024-07-16 (×5): qty 90, 90d supply, fill #0

## 2024-05-12 ENCOUNTER — Other Ambulatory Visit (HOSPITAL_COMMUNITY): Payer: Self-pay

## 2024-05-12 ENCOUNTER — Other Ambulatory Visit (HOSPITAL_BASED_OUTPATIENT_CLINIC_OR_DEPARTMENT_OTHER): Payer: Self-pay

## 2024-05-12 MED ORDER — DEXCOM G7 SENSOR MISC: 5 refills | Status: DC

## 2024-05-12 MED ORDER — TAMSULOSIN HCL 0.4 MG PO CAPS
0.4000 mg | ORAL_CAPSULE | Freq: Every day | ORAL | 3 refills | Status: DC
  Filled 2024-05-13 – 2024-06-02 (×2): qty 90, 90d supply, fill #0

## 2024-05-12 MED ORDER — BRIMONIDINE TARTRATE 0.2 % OP SOLN: 1.0000 [drp] | Freq: Every morning | OPHTHALMIC | 4 refills | Status: DC

## 2024-05-12 MED ORDER — OMEPRAZOLE 20 MG PO CPDR
20.0000 mg | DELAYED_RELEASE_CAPSULE | Freq: Every day | ORAL | 3 refills | Status: DC
  Filled 2024-05-13 – 2024-06-02 (×2): qty 90, 90d supply, fill #0

## 2024-05-12 MED ORDER — AMLODIPINE BESYLATE 5 MG PO TABS
5.0000 mg | ORAL_TABLET | Freq: Every day | ORAL | 1 refills | Status: DC
  Filled 2024-05-13 – 2024-06-02 (×2): qty 90, 90d supply, fill #0

## 2024-05-12 MED ORDER — HYDRALAZINE HCL 100 MG PO TABS: 100.0000 mg | ORAL_TABLET | Freq: Three times a day (TID) | ORAL | 3 refills | Status: DC

## 2024-05-12 MED ORDER — LISINOPRIL 10 MG PO TABS: 10.0000 mg | ORAL_TABLET | Freq: Every day | ORAL | 1 refills | Status: DC

## 2024-05-12 MED ORDER — LATANOPROST 0.005 % OP SOLN
1.0000 [drp] | Freq: Every evening | OPHTHALMIC | 6 refills | Status: DC
  Filled 2024-06-02: qty 7.5, 150d supply, fill #0

## 2024-05-13 ENCOUNTER — Other Ambulatory Visit: Payer: Self-pay

## 2024-05-13 ENCOUNTER — Other Ambulatory Visit (HOSPITAL_COMMUNITY): Payer: Self-pay

## 2024-05-13 MED FILL — Atorvastatin Calcium Tab 40 MG (Base Equivalent): ORAL | 24 days supply | Qty: 24 | Fill #0 | Status: CN

## 2024-05-27 ENCOUNTER — Ambulatory Visit (HOSPITAL_BASED_OUTPATIENT_CLINIC_OR_DEPARTMENT_OTHER): Admitting: Family Medicine

## 2024-06-02 ENCOUNTER — Other Ambulatory Visit: Payer: Self-pay | Admitting: Family Medicine

## 2024-06-02 ENCOUNTER — Ambulatory Visit (INDEPENDENT_AMBULATORY_CARE_PROVIDER_SITE_OTHER): Admitting: Family Medicine

## 2024-06-02 ENCOUNTER — Other Ambulatory Visit: Payer: Self-pay

## 2024-06-02 ENCOUNTER — Other Ambulatory Visit (HOSPITAL_BASED_OUTPATIENT_CLINIC_OR_DEPARTMENT_OTHER): Payer: Self-pay

## 2024-06-02 ENCOUNTER — Encounter (HOSPITAL_BASED_OUTPATIENT_CLINIC_OR_DEPARTMENT_OTHER): Payer: Self-pay | Admitting: Family Medicine

## 2024-06-02 ENCOUNTER — Other Ambulatory Visit (HOSPITAL_COMMUNITY): Payer: Self-pay

## 2024-06-02 VITALS — BP 150/71 | Temp 97.4°F | Resp 16 | Wt 177.8 lb

## 2024-06-02 DIAGNOSIS — R5383 Other fatigue: Secondary | ICD-10-CM

## 2024-06-02 DIAGNOSIS — I10 Essential (primary) hypertension: Secondary | ICD-10-CM

## 2024-06-02 DIAGNOSIS — E118 Type 2 diabetes mellitus with unspecified complications: Secondary | ICD-10-CM

## 2024-06-02 DIAGNOSIS — E7800 Pure hypercholesterolemia, unspecified: Secondary | ICD-10-CM | POA: Diagnosis not present

## 2024-06-02 MED ORDER — ATORVASTATIN CALCIUM 40 MG PO TABS: 40.0000 mg | ORAL_TABLET | Freq: Every day | ORAL | 1 refills | Status: DC

## 2024-06-02 MED ORDER — AMLODIPINE BESYLATE 5 MG PO TABS: 5.0000 mg | ORAL_TABLET | Freq: Every day | ORAL | 1 refills | Status: DC

## 2024-06-02 MED ORDER — AMLODIPINE BESYLATE 5 MG PO TABS: 5.0000 mg | ORAL_TABLET | Freq: Every day | ORAL | 3 refills | Status: DC

## 2024-06-02 MED ORDER — AMLODIPINE BESYLATE 5 MG PO TABS
5.0000 mg | ORAL_TABLET | Freq: Every day | ORAL | 3 refills | Status: AC
  Filled 2024-06-02: qty 90, 90d supply, fill #0
  Filled 2024-08-18: qty 90, 90d supply, fill #1

## 2024-06-02 NOTE — Assessment & Plan Note (Signed)
 Mildly uncontrolled.  DASH diet.  Will check on this in a few days, and his ACE may need to be increased.

## 2024-06-02 NOTE — Progress Notes (Signed)
 Established Patient Office Visit  Subjective   Patient ID: Troy Wu, male    DOB: Jul 03, 1946  Age: 78 y.o. MRN: [id number]  Chief Complaint  Patient presents with   Follow-up    Follow-up     F/u as above.  See previous notes for details.  He is overdue to be seen due to his wife's health issues.  His standing BP at home has generally been well controlled.  He has had occasional hypoglycemia which needs to be addressed.    Past Medical History:  Diagnosis Date   Detached retina, left    x2   Diabetes mellitus with microalbuminuria (HCC)    Sees eye doctor annually   Dyslipidemia    Former smoker    30 pack year history before quitting 2000   GERD (gastroesophageal reflux disease)    Glaucoma    f/by eye doctor   Hard of hearing    wears bilateral hearing aids   Hypertension    Prostate cancer (HCC)    Distant past.  Known to Urology in [place] with rx declined   Sleep apnea    does not use CPAP    Outpatient Encounter Medications as of 06/02/2024  Medication Sig   brimonidine  (ALPHAGAN ) 0.2 % ophthalmic solution INSTILL 1 DROP INTO LEFT EYE EVERY MORNING   Continuous Glucose Sensor (DEXCOM G7 SENSOR) MISC Change sensor every 10 days   doxazosin  (CARDURA ) 8 MG tablet Take 8 mg by mouth daily.   dutasteride  (AVODART ) 0.5 MG capsule TAKE 1 CAPSULE BY MOUTH EVERY DAY   empagliflozin  (JARDIANCE ) 10 MG TABS tablet Take 1 tablet (10 mg total) by mouth daily before breakfast.   glimepiride  (AMARYL ) 2 MG tablet Take 1 tablet (2 mg total) by mouth daily before breakfast.   hydrALAZINE  (APRESOLINE ) 100 MG tablet Take 1 tablet (100 mg total) by mouth 3 (three) times daily with food.   latanoprost  (XALATAN ) 0.005 % ophthalmic solution Place 1 drop into both eyes at bedtime.   lisinopril  (ZESTRIL ) 10 MG tablet Take 1 tablet (10 mg total) by mouth daily.   lisinopril  (ZESTRIL ) 20 MG tablet Take 1 tablet (20 mg total) by mouth daily.   metFORMIN  (GLUCOPHAGE -XR) 500 MG 24 hr  tablet TAKE 1 TABLET WITH EVENING MEAL EVERY DAY BY MOUTH   methylPREDNISolone  (MEDROL  DOSEPAK) 4 MG TBPK tablet 6 day dosepack as directed   omeprazole  (PRILOSEC) 20 MG capsule Take 20 mg by mouth daily.   tamsulosin  (FLOMAX ) 0.4 MG CAPS capsule Take 0.4 mg by mouth daily.   [DISCONTINUED] amLODipine  (NORVASC ) 5 MG tablet Take 5 mg by mouth daily.   [DISCONTINUED] amLODipine  (NORVASC ) 5 MG tablet Take 1 tablet (5 mg total) by mouth daily.   [DISCONTINUED] atorvastatin  (LIPITOR) 40 MG tablet Take 40 mg by mouth.   [DISCONTINUED] brimonidine  (ALPHAGAN ) 0.2 % ophthalmic solution Place 1 drop into the left eye in the morning.   [DISCONTINUED] glimepiride  (AMARYL ) 2 MG tablet TAKE 1 TABLET BY MOUTH DAILY BEFORE BREAKFAST.   [DISCONTINUED] latanoprost  (XALATAN ) 0.005 % ophthalmic solution Place 1 drop into both eyes every evening.   [DISCONTINUED] omeprazole  (PRILOSEC) 20 MG capsule Take 1 capsule (20 mg total) by mouth daily.   [DISCONTINUED] tamsulosin  (FLOMAX ) 0.4 MG CAPS capsule Take 1 capsule (0.4 mg total) by mouth daily 30 mintues after the same meal each day   ALPHA LIPOIC ACID PO Take by mouth. (Patient not taking: Reported on 04/02/2024)   amLODipine  (NORVASC ) 5 MG tablet Take 1 tablet (  5 mg total) by mouth daily.   aspirin  EC 81 MG tablet Take 1 tablet (81 mg total) by mouth daily. Swallow whole. (Patient not taking: Reported on 04/02/2024)   atorvastatin  (LIPITOR) 40 MG tablet TAKE 1 TABLET BY MOUTH EVERY DAY (Patient not taking: Reported on 04/02/2024)   atorvastatin  (LIPITOR) 40 MG tablet Take 1 tablet (40 mg total) by mouth daily.   [DISCONTINUED] amLODipine  (NORVASC ) 5 MG tablet Take 1 tablet (5 mg total) by mouth daily.   [DISCONTINUED] amLODipine  (NORVASC ) 5 MG tablet Take 1 tablet (5 mg total) by mouth daily.   No facility-administered encounter medications on file as of 06/02/2024.    Social History   Tobacco Use   Smoking status: Former    Current packs/day: 0.00    Average  packs/day: 2.0 packs/day for 30.0 years (60.0 ttl pk-yrs)    Types: Cigarettes    Start date: 07/19/1968    Quit date: 07/19/1998    Years since quitting: 25.8   Smokeless tobacco: Never  Vaping Use   Vaping status: Never Used  Substance Use Topics   Alcohol  use: Never   Drug use: Never      Review of Systems  Constitutional:  Negative for diaphoresis, fever, malaise/fatigue and weight loss.  Respiratory:  Negative for cough, shortness of breath and wheezing.   Cardiovascular:  Negative for chest pain, palpitations, orthopnea, claudication, leg swelling and PND.      Objective:     BP (!) 150/71 (BP Location: Right Arm, Patient Position: Standing, Cuff Size: Normal)   Temp (!) 97.4 F (36.3 C) (Oral)   Resp 16   Wt 177 lb 12.8 oz (80.6 kg)   SpO2 96%   BMI 26.64 kg/m    Physical Exam Constitutional:      General: He is not in acute distress.    Appearance: Normal appearance.  HENT:     Head: Normocephalic.  Neck:     Vascular: No carotid bruit.  Cardiovascular:     Rate and Rhythm: Normal rate and regular rhythm.     Pulses: Normal pulses.     Heart sounds: Normal heart sounds.  Pulmonary:     Effort: Pulmonary effort is normal.     Breath sounds: Normal breath sounds.  Abdominal:     General: Bowel sounds are normal.     Palpations: Abdomen is soft.  Musculoskeletal:     Cervical back: Neck supple. No tenderness.     Right lower leg: No edema.     Left lower leg: No edema.  Neurological:     Mental Status: He is alert.      No results found for any visits on 06/02/24.    The ASCVD Risk score (Arnett DK, et al., 2019) failed to calculate for the following reasons:   The 2019 ASCVD risk score is only valid for ages 53 to 36    Assessment & Plan:  Primary hypertension Assessment & Plan: Mildly uncontrolled.  DASH diet.  Will check on this in a few days, and his ACE may need to be increased.  Orders: -     amLODIPine  Besylate; Take 1 tablet (5  mg total) by mouth daily.  Dispense: 90 tablet; Refill: 3  Type 2 diabetes mellitus with complications [facility]) Assessment & Plan: Await labs and f/u with him soon.  May need to reduce her stop his Glimepiride .  Orders: -     Hemoglobin A1c -     CBC with Differential/Platelet -  Comprehensive metabolic panel with GFR -     Microalbumin / creatinine urine ratio  Hypercholesterolemia -     Lipid panel -     Atorvastatin  Calcium ; Take 1 tablet (40 mg total) by mouth daily.  Dispense: 90 tablet; Refill: 1  Fatigue, unspecified type -     Vitamin B12    Return in about 3 months (around 09/01/2024) for chronic follow-up.    REDDING PONCE NORLEEN FALCON., MD

## 2024-06-02 NOTE — Assessment & Plan Note (Addendum)
 Await labs and f/u with him soon.  May need to reduce her stop his Glimepiride .

## 2024-06-03 ENCOUNTER — Other Ambulatory Visit (HOSPITAL_COMMUNITY): Payer: Self-pay

## 2024-06-03 ENCOUNTER — Other Ambulatory Visit: Payer: Self-pay

## 2024-06-03 ENCOUNTER — Other Ambulatory Visit (HOSPITAL_BASED_OUTPATIENT_CLINIC_OR_DEPARTMENT_OTHER): Payer: Self-pay

## 2024-06-03 ENCOUNTER — Ambulatory Visit (HOSPITAL_BASED_OUTPATIENT_CLINIC_OR_DEPARTMENT_OTHER): Payer: Self-pay | Admitting: Family Medicine

## 2024-06-03 LAB — CBC WITH DIFFERENTIAL/PLATELET
Basophils Absolute: 0.1 x10E3/uL (ref 0.0–0.2)
Basos: 1 %
EOS (ABSOLUTE): 0.3 x10E3/uL (ref 0.0–0.4)
Eos: 4 %
Hematocrit: 41.4 % (ref 37.5–51.0)
Hemoglobin: 14.1 g/dL (ref 13.0–17.7)
Immature Grans (Abs): 0 x10E3/uL (ref 0.0–0.1)
Immature Granulocytes: 0 %
Lymphocytes Absolute: 1.9 x10E3/uL (ref 0.7–3.1)
Lymphs: 31 %
MCH: 32 pg (ref 26.6–33.0)
MCHC: 34.1 g/dL (ref 31.5–35.7)
MCV: 94 fL (ref 79–97)
Monocytes Absolute: 0.6 x10E3/uL (ref 0.1–0.9)
Monocytes: 9 %
Neutrophils Absolute: 3.5 x10E3/uL (ref 1.4–7.0)
Neutrophils: 55 %
Platelets: 165 x10E3/uL (ref 150–450)
RBC: 4.4 x10E6/uL (ref 4.14–5.80)
RDW: 13 % (ref 11.6–15.4)
WBC: 6.3 x10E3/uL (ref 3.4–10.8)

## 2024-06-03 LAB — COMPREHENSIVE METABOLIC PANEL WITH GFR
ALT: 14 IU/L (ref 0–44)
AST: 10 IU/L (ref 0–40)
Albumin: 4.4 g/dL (ref 3.8–4.8)
Alkaline Phosphatase: 116 IU/L (ref 47–123)
BUN/Creatinine Ratio: 18 (ref 10–24)
BUN: 16 mg/dL (ref 8–27)
Bilirubin Total: 0.6 mg/dL (ref 0.0–1.2)
CO2: 22 mmol/L (ref 20–29)
Calcium: 9.5 mg/dL (ref 8.6–10.2)
Chloride: 106 mmol/L (ref 96–106)
Creatinine, Ser: 0.9 mg/dL (ref 0.76–1.27)
Globulin, Total: 2.2 g/dL (ref 1.5–4.5)
Glucose: 90 mg/dL (ref 70–99)
Potassium: 4.2 mmol/L (ref 3.5–5.2)
Sodium: 142 mmol/L (ref 134–144)
Total Protein: 6.6 g/dL (ref 6.0–8.5)
eGFR: 86 mL/min/1.73 (ref >59)

## 2024-06-03 LAB — VITAMIN B12: Vitamin B-12: 684 pg/mL (ref 232–1245)

## 2024-06-03 LAB — LIPID PANEL
Chol/HDL Ratio: 4.3 ratio (ref 0.0–5.0)
Cholesterol, Total: 184 mg/dL (ref 100–199)
HDL: 43 mg/dL (ref >39)
LDL Chol Calc (NIH): 122 mg/dL — ABNORMAL HIGH (ref 0–99)
Triglycerides: 105 mg/dL (ref 0–149)
VLDL Cholesterol Cal: 19 mg/dL (ref 5–40)

## 2024-06-03 LAB — HEMOGLOBIN A1C
Est. average glucose Bld gHb Est-mCnc: 120 mg/dL
Hgb A1c MFr Bld: 5.8 % — ABNORMAL HIGH (ref 4.8–5.6)

## 2024-06-03 LAB — MICROALBUMIN / CREATININE URINE RATIO
Creatinine, Urine: 48.7 mg/dL
Microalb/Creat Ratio: <6 mg/g{creat} (ref 0–29)
Microalbumin, Urine: <3 ug/mL

## 2024-06-04 ENCOUNTER — Other Ambulatory Visit (HOSPITAL_BASED_OUTPATIENT_CLINIC_OR_DEPARTMENT_OTHER): Payer: Self-pay

## 2024-06-04 ENCOUNTER — Telehealth (HOSPITAL_BASED_OUTPATIENT_CLINIC_OR_DEPARTMENT_OTHER): Payer: Self-pay | Admitting: *Deleted

## 2024-06-04 DIAGNOSIS — E1129 Type 2 diabetes mellitus with other diabetic kidney complication: Secondary | ICD-10-CM

## 2024-06-04 MED ORDER — DOXAZOSIN MESYLATE 8 MG PO TABS
8.0000 mg | ORAL_TABLET | Freq: Every day | ORAL | 1 refills | Status: AC
  Filled 2024-06-04: qty 90, 90d supply, fill #0
  Filled 2024-08-28: qty 90, 90d supply, fill #1

## 2024-06-04 MED ORDER — LISINOPRIL 10 MG PO TABS
10.0000 mg | ORAL_TABLET | Freq: Every day | ORAL | 1 refills | Status: DC
  Filled 2024-06-04: qty 90, 90d supply, fill #0

## 2024-06-04 MED ORDER — METFORMIN HCL ER 500 MG PO TB24
500.0000 mg | ORAL_TABLET | Freq: Every evening | ORAL | 1 refills | Status: AC
  Filled 2024-06-04: qty 90, 90d supply, fill #0
  Filled 2024-08-28: qty 90, 90d supply, fill #1

## 2024-06-04 MED ORDER — GLIMEPIRIDE 2 MG PO TABS
2.0000 mg | ORAL_TABLET | Freq: Every day | ORAL | 1 refills | Status: DC
  Filled 2024-06-04: qty 90, 90d supply, fill #0

## 2024-06-04 NOTE — Telephone Encounter (Signed)
 Sent to correct pharmacy for patient.     Copied from CRM [id number]. Topic: Clinical - Prescription Issue >> Jun 03, 2024  2:03 PM Chasity T wrote: Reason for CRM: Patient is calling because he is having trouble getting 4 medications moved over from the [facility] pharmacy to [facility] pharmacy. States that someone name phillip was helping him at the [facility] pharmacy and says that Dr Dottie will need to put in a prescription for these medications for him to be able to get them done there. Please contact patient back to speak regarding concerns on medication if needed.  lisinopril  (ZESTRIL ) 20 MG tablet glimepiride  (AMARYL ) 2 MG tablet doxazosin  (CARDURA ) 8 MG tablet metFORMIN  (GLUCOPHAGE -XR) 500 MG 24 hr tablet

## 2024-06-05 ENCOUNTER — Other Ambulatory Visit (HOSPITAL_BASED_OUTPATIENT_CLINIC_OR_DEPARTMENT_OTHER): Payer: Self-pay

## 2024-06-06 ENCOUNTER — Other Ambulatory Visit (HOSPITAL_BASED_OUTPATIENT_CLINIC_OR_DEPARTMENT_OTHER): Payer: Self-pay | Admitting: Family Medicine

## 2024-06-06 ENCOUNTER — Other Ambulatory Visit (HOSPITAL_BASED_OUTPATIENT_CLINIC_OR_DEPARTMENT_OTHER): Payer: Self-pay

## 2024-06-06 MED ORDER — LATANOPROST 0.005 % OP SOLN
1.0000 [drp] | Freq: Every evening | OPHTHALMIC | 6 refills | Status: DC
  Filled 2024-06-06: qty 7.5, 84d supply, fill #0

## 2024-06-09 ENCOUNTER — Other Ambulatory Visit (HOSPITAL_BASED_OUTPATIENT_CLINIC_OR_DEPARTMENT_OTHER): Payer: Self-pay

## 2024-06-18 ENCOUNTER — Other Ambulatory Visit (HOSPITAL_BASED_OUTPATIENT_CLINIC_OR_DEPARTMENT_OTHER): Payer: Self-pay

## 2024-06-20 ENCOUNTER — Telehealth (HOSPITAL_BASED_OUTPATIENT_CLINIC_OR_DEPARTMENT_OTHER): Payer: Self-pay | Admitting: Family Medicine

## 2024-06-20 ENCOUNTER — Other Ambulatory Visit (HOSPITAL_BASED_OUTPATIENT_CLINIC_OR_DEPARTMENT_OTHER): Payer: Self-pay

## 2024-06-20 DIAGNOSIS — H401131 Primary open-angle glaucoma, bilateral, mild stage: Secondary | ICD-10-CM | POA: Diagnosis not present

## 2024-06-20 DIAGNOSIS — Z961 Presence of intraocular lens: Secondary | ICD-10-CM | POA: Diagnosis not present

## 2024-06-20 DIAGNOSIS — H59812 Chorioretinal scars after surgery for detachment, left eye: Secondary | ICD-10-CM | POA: Diagnosis not present

## 2024-06-20 DIAGNOSIS — H524 Presbyopia: Secondary | ICD-10-CM | POA: Diagnosis not present

## 2024-06-20 DIAGNOSIS — H26491 Other secondary cataract, right eye: Secondary | ICD-10-CM | POA: Diagnosis not present

## 2024-06-20 MED ORDER — DORZOLAMIDE HCL-TIMOLOL MAL 2-0.5 % OP SOLN
1.0000 [drp] | Freq: Two times a day (BID) | OPHTHALMIC | 3 refills | Status: AC
  Filled 2024-06-20: qty 10, 50d supply, fill #0
  Filled 2024-08-04: qty 10, 50d supply, fill #1

## 2024-06-20 NOTE — Telephone Encounter (Signed)
 Patient came into the med center asking to make an appt for a nurse visit blood pressure check and to make an appt with Dr. Dottie in regards of getting a possible referral to urology. Patient is aware that a nurse would contact him Monday to get appts scheduled.

## 2024-06-26 ENCOUNTER — Other Ambulatory Visit (HOSPITAL_BASED_OUTPATIENT_CLINIC_OR_DEPARTMENT_OTHER): Payer: Self-pay

## 2024-06-26 ENCOUNTER — Ambulatory Visit (INDEPENDENT_AMBULATORY_CARE_PROVIDER_SITE_OTHER): Admitting: Family Medicine

## 2024-06-26 ENCOUNTER — Encounter (HOSPITAL_BASED_OUTPATIENT_CLINIC_OR_DEPARTMENT_OTHER): Payer: Self-pay | Admitting: Family Medicine

## 2024-06-26 ENCOUNTER — Other Ambulatory Visit (HOSPITAL_BASED_OUTPATIENT_CLINIC_OR_DEPARTMENT_OTHER): Payer: Self-pay | Admitting: Family Medicine

## 2024-06-26 VITALS — BP 144/72 | HR 77 | Resp 16 | Wt 178.9 lb

## 2024-06-26 DIAGNOSIS — E118 Type 2 diabetes mellitus with unspecified complications: Secondary | ICD-10-CM | POA: Diagnosis not present

## 2024-06-26 DIAGNOSIS — N400 Enlarged prostate without lower urinary tract symptoms: Secondary | ICD-10-CM

## 2024-06-26 DIAGNOSIS — E785 Hyperlipidemia, unspecified: Secondary | ICD-10-CM | POA: Diagnosis not present

## 2024-06-26 DIAGNOSIS — I10 Essential (primary) hypertension: Secondary | ICD-10-CM | POA: Diagnosis not present

## 2024-06-26 DIAGNOSIS — Z8546 Personal history of malignant neoplasm of prostate: Secondary | ICD-10-CM | POA: Diagnosis not present

## 2024-06-26 DIAGNOSIS — D489 Neoplasm of uncertain behavior, unspecified: Secondary | ICD-10-CM | POA: Insufficient documentation

## 2024-06-26 NOTE — Assessment & Plan Note (Addendum)
 More appropriately treated.  Update us  on home sugars next week.  Advised 1800 ADA diet and regular low impact exercise.

## 2024-06-26 NOTE — Assessment & Plan Note (Signed)
 Fair control.  Continue to monitor per [facility] protocol.

## 2024-06-26 NOTE — Progress Notes (Signed)
 Established Patient Office Visit  Subjective   Patient ID: Troy Wu, male    DOB: Sep 08, 1946  Age: 78 y.o. MRN: [id number]  No chief complaint on file.   F/u as above.  Please see last note for details.  He was recently advised to cut back on his diabetic medication and I'm sorry to hear that he lost interest in the statin.  Extended discussion about statin benefits in lay terms.  He is now frustrated with his nocturnal polyuria and relatively weak stream.  He is pretty careful with his evening fluids.  He is now interested in seeing Urology and discussing both his h/o Prostate CA and his urinary symptoms.  He has appropriately stopped his Glimepride but remains on the Metformin  appropriately.  He agrees to update me on his home sugars in the coming week.      Past Medical History:  Diagnosis Date   Detached retina, left    x2   Diabetes mellitus with microalbuminuria (HCC)    Sees eye doctor annually   Dyslipidemia    Rx declined   Former smoker    30 pack year history before quitting 2000   GERD (gastroesophageal reflux disease)    Glaucoma    f/by eye doctor   Hard of hearing    wears bilateral hearing aids   Hypertension    Prostate cancer (HCC)    Distant past.  Known to Urology in [place] with rx declined   Sleep apnea    does not use CPAP    Outpatient Encounter Medications as of 06/26/2024  Medication Sig   amLODipine  (NORVASC ) 5 MG tablet Take 1 tablet (5 mg total) by mouth daily.   dorzolamide-timolol (COSOPT) 2-0.5 % ophthalmic solution Instill 1 drop into both eyes twice a day   doxazosin  (CARDURA ) 8 MG tablet Take 1 tablet (8 mg total) by mouth daily.   dutasteride  (AVODART ) 0.5 MG capsule TAKE 1 CAPSULE BY MOUTH EVERY DAY   hydrALAZINE  (APRESOLINE ) 100 MG tablet Take 1 tablet (100 mg total) by mouth 3 (three) times daily with food.   latanoprost  (XALATAN ) 0.005 % ophthalmic solution Place 1 drop into both eyes at bedtime.   lisinopril  (ZESTRIL ) 20 MG  tablet Take 1 tablet (20 mg total) by mouth daily.   metFORMIN  (GLUCOPHAGE -XR) 500 MG 24 hr tablet Take 1 tablet (500 mg total) by mouth every evening.   omeprazole  (PRILOSEC) 20 MG capsule Take 20 mg by mouth daily.   tamsulosin  (FLOMAX ) 0.4 MG CAPS capsule Take 0.4 mg by mouth daily.   [DISCONTINUED] empagliflozin  (JARDIANCE ) 10 MG TABS tablet Take 1 tablet (10 mg total) by mouth daily before breakfast.   [DISCONTINUED] latanoprost  (XALATAN ) 0.005 % ophthalmic solution Place 1 drop into both eyes every evening.   [DISCONTINUED] methylPREDNISolone  (MEDROL  DOSEPAK) 4 MG TBPK tablet 6 day dosepack as directed   ALPHA LIPOIC ACID PO Take by mouth. (Patient not taking: Reported on 04/02/2024)   aspirin  EC 81 MG tablet Take 1 tablet (81 mg total) by mouth daily. Swallow whole. (Patient not taking: Reported on 04/02/2024)   atorvastatin  (LIPITOR) 40 MG tablet TAKE 1 TABLET BY MOUTH EVERY DAY (Patient not taking: Reported on 04/02/2024)   brimonidine  (ALPHAGAN ) 0.2 % ophthalmic solution INSTILL 1 DROP INTO LEFT EYE EVERY MORNING (Patient not taking: Reported on 06/26/2024)   Continuous Glucose Sensor (DEXCOM G7 SENSOR) MISC Change sensor every 10 days   glimepiride  (AMARYL ) 2 MG tablet Take 1 tablet (2 mg total) by mouth  daily before breakfast.   [DISCONTINUED] atorvastatin  (LIPITOR) 40 MG tablet Take 1 tablet (40 mg total) by mouth daily.   No facility-administered encounter medications on file as of 06/26/2024.    Social History   Tobacco Use   Smoking status: Former    Current packs/day: 0.00    Average packs/day: 2.0 packs/day for 30.0 years (60.0 ttl pk-yrs)    Types: Cigarettes    Start date: 07/19/1968    Quit date: 07/19/1998    Years since quitting: 25.9   Smokeless tobacco: Never  Vaping Use   Vaping status: Never Used  Substance Use Topics   Alcohol  use: Never   Drug use: Never      Review of Systems  Constitutional:  Negative for diaphoresis, fever, malaise/fatigue and weight  loss.  Respiratory:  Negative for cough, shortness of breath and wheezing.   Cardiovascular:  Negative for chest pain, palpitations, orthopnea, claudication, leg swelling and PND.  Genitourinary:  Positive for frequency and urgency. Negative for dysuria, flank pain and hematuria.      Objective:     BP (!) 144/72 (BP Location: Left Arm, Patient Position: Standing, Cuff Size: Normal)   Pulse 77   Resp 16   Wt 178 lb 14.4 oz (81.1 kg)   SpO2 94%   BMI 26.81 kg/m    Physical Exam Constitutional:      General: He is not in acute distress.    Appearance: Normal appearance.  HENT:     Head: Normocephalic.  Neck:     Vascular: No carotid bruit.  Cardiovascular:     Rate and Rhythm: Normal rate and regular rhythm.     Pulses: Normal pulses.     Heart sounds: Normal heart sounds.  Pulmonary:     Effort: Pulmonary effort is normal.     Breath sounds: Normal breath sounds.  Abdominal:     General: Bowel sounds are normal.     Palpations: Abdomen is soft.  Musculoskeletal:     Cervical back: Neck supple. No tenderness.     Right lower leg: No edema.     Left lower leg: No edema.     Comments: Feet look good.  Skin:    Comments: Scattered atypical nevi  Neurological:     Mental Status: He is alert.      No results found for any visits on 06/26/24.    The ASCVD Risk score (Arnett DK, et al., 2019) failed to calculate for the following reasons:   The 2019 ASCVD risk score is only valid for ages 24 to 49    Assessment & Plan:  Prostate hypertrophy Assessment & Plan: Increase his Tamsulosin  to 2 pills at hs, and alert us  if not urinating easier in the coming days.   History of prostate cancer -     Ambulatory referral to Urology  Type 2 diabetes mellitus with complications [facility]) Assessment & Plan: More appropriately treated.  Update us  on home sugars next week.  Advised 1800 ADA diet and regular low impact exercise.   Primary hypertension Assessment &  Plan: Fair control.  Continue to monitor per [facility] protocol.   Dyslipidemia Assessment & Plan: Rx declined.  Discussion as above.   Neoplasm of uncertain behavior -     Ambulatory referral to Dermatology    Return in about 3 months (around 09/26/2024) for chronic follow-up.    REDDING PONCE NORLEEN FALCON., MD

## 2024-06-26 NOTE — Assessment & Plan Note (Signed)
 Increase his Tamsulosin  to 2 pills at hs, and alert us  if not urinating easier in the coming days.

## 2024-06-26 NOTE — Assessment & Plan Note (Signed)
 Rx declined.  Discussion as above.

## 2024-07-02 ENCOUNTER — Encounter (HOSPITAL_BASED_OUTPATIENT_CLINIC_OR_DEPARTMENT_OTHER): Payer: Self-pay | Admitting: Family Medicine

## 2024-07-02 ENCOUNTER — Encounter (HOSPITAL_BASED_OUTPATIENT_CLINIC_OR_DEPARTMENT_OTHER): Payer: Self-pay

## 2024-07-02 ENCOUNTER — Ambulatory Visit (INDEPENDENT_AMBULATORY_CARE_PROVIDER_SITE_OTHER)

## 2024-07-02 VITALS — BP 131/78 | Ht 68.5 in | Wt 175.0 lb

## 2024-07-02 DIAGNOSIS — Z0000 Encounter for general adult medical examination without abnormal findings: Secondary | ICD-10-CM | POA: Diagnosis not present

## 2024-07-02 NOTE — Progress Notes (Signed)
 Because this visit was a virtual/telehealth visit,  certain criteria was not obtained, such a blood pressure, CBG if applicable, and timed get up and go. Any medications not marked as taking were not mentioned during the medication reconciliation part of the visit. Any vitals not documented were not able to be obtained due to this being a telehealth visit or patient was unable to self-report a recent blood pressure reading due to a lack of equipment at home via telehealth. Vitals that have been documented are verbally provided by the patient.  This visit was performed by a medical professional under my direct supervision. I was immediately available for consultation/collaboration. I have reviewed and agree with the Annual Wellness Visit documentation.  Subjective:   Troy Wu is a 78 y.o. who presents for a Medicare Wellness preventive visit.  As a reminder, Annual Wellness Visits don't include a physical exam, and some assessments may be limited, especially if this visit is performed virtually. We may recommend an in-person follow-up visit with your provider if needed.  Visit Complete: Virtual I connected with  Troy Wu on 07/02/24 by a audio enabled telemedicine application and verified that I am speaking with the correct person using two identifiers.  Patient Location: Home  Provider Location: Home Office  I discussed the limitations of evaluation and management by telemedicine. The patient expressed understanding and agreed to proceed.  Vital Signs: Because this visit was a virtual/telehealth visit, some criteria may be missing or patient reported. Any vitals not documented were not able to be obtained and vitals that have been documented are patient reported.  VideoDeclined- This patient declined Librarian, academic. Therefore the visit was completed with audio only.  Persons Participating in Visit: Patient.  AWV Questionnaire: No: Patient  Medicare AWV questionnaire was not completed prior to this visit.  Cardiac Risk Factors include: advanced age (>36men, >8 women);male gender;hypertension;diabetes mellitus;dyslipidemia     Objective:    Today's Vitals   07/02/24 1557 07/02/24 1559  BP: 131/78   Weight: 175 lb (79.4 kg)   Height: 5' 8.5 (1.74 m)   PainSc:  6    Body mass index is 26.22 kg/m.     07/02/2024    4:02 PM 08/14/2018   10:50 AM 08/14/2018    5:56 AM  Advanced Directives  Does Patient Have a Medical Advance Directive? Yes No  No   Type of Estate agent of [place];Living will    Does patient want to make changes to medical advance directive? No - Patient declined    Copy of Healthcare Power of Attorney in Chart? No - copy requested    Would patient like information on creating a medical advance directive?  No - Patient declined  No - Patient declined      Data saved with a previous flowsheet row definition    Current Medications (verified) Outpatient Encounter Medications as of 07/02/2024  Medication Sig   amLODipine  (NORVASC ) 5 MG tablet Take 1 tablet (5 mg total) by mouth daily.   brimonidine  (ALPHAGAN ) 0.2 % ophthalmic solution INSTILL 1 DROP INTO LEFT EYE EVERY MORNING   dorzolamide-timolol (COSOPT) 2-0.5 % ophthalmic solution Instill 1 drop into both eyes twice a day   doxazosin  (CARDURA ) 8 MG tablet Take 1 tablet (8 mg total) by mouth daily.   hydrALAZINE  (APRESOLINE ) 100 MG tablet Take 1 tablet (100 mg total) by mouth 3 (three) times daily with food.   latanoprost  (XALATAN ) 0.005 % ophthalmic solution Place 1  drop into both eyes at bedtime.   lisinopril  (ZESTRIL ) 20 MG tablet Take 1 tablet (20 mg total) by mouth daily.   metFORMIN  (GLUCOPHAGE -XR) 500 MG 24 hr tablet Take 1 tablet (500 mg total) by mouth every evening.   omeprazole  (PRILOSEC) 20 MG capsule Take 20 mg by mouth daily.   tamsulosin  (FLOMAX ) 0.4 MG CAPS capsule Take 0.4 mg by mouth daily.   ALPHA LIPOIC  ACID PO Take by mouth. (Patient not taking: Reported on 07/02/2024)   aspirin  EC 81 MG tablet Take 1 tablet (81 mg total) by mouth daily. Swallow whole. (Patient not taking: Reported on 07/02/2024)   atorvastatin  (LIPITOR) 40 MG tablet TAKE 1 TABLET BY MOUTH EVERY DAY (Patient not taking: Reported on 07/02/2024)   Continuous Glucose Sensor (DEXCOM G7 SENSOR) MISC Change sensor every 10 days   dutasteride  (AVODART ) 0.5 MG capsule TAKE 1 CAPSULE BY MOUTH EVERY DAY (Patient not taking: Reported on 07/02/2024)   glimepiride  (AMARYL ) 2 MG tablet Take 1 tablet (2 mg total) by mouth daily before breakfast.   No facility-administered encounter medications on file as of 07/02/2024.    Allergies (verified) Codeine and Levonorgestrel-ethinyl estrad   History: Past Medical History:  Diagnosis Date   Detached retina, left    x2   Diabetes mellitus with microalbuminuria (HCC)    Sees eye doctor annually   Dyslipidemia    Rx declined   Former smoker    30 pack year history before quitting 2000   GERD (gastroesophageal reflux disease)    Glaucoma    f/by eye doctor   Hard of hearing    wears bilateral hearing aids   Hypertension    Prostate cancer (HCC)    Distant past.  Known to Urology in [place] with rx declined   Sleep apnea    does not use CPAP   Past Surgical History:  Procedure Laterality Date   CATARACT EXTRACTION, BILATERAL     CHOLECYSTECTOMY     EYE SURGERY     HERNIA REPAIR     LUMBAR LAMINECTOMY/DECOMPRESSION MICRODISCECTOMY Right 08/14/2018   Procedure: Right L4-5 decompression/disectomy;  Surgeon: Burnetta Aures, MD;  Location: [facility] OR;  Service: Orthopedics;  Laterality: Right;  2.5 hrs   RETINAL DETACHMENT SURGERY     VASECTOMY     Family History  Problem Relation Age of Onset   Hypertension Mother    Hypertension Father    Dementia Sister    Social History   Socioeconomic History   Marital status: Married    Spouse name: Not on file   Number of children: 4    Years of education: Not on file   Highest education level: Not on file  Occupational History   Occupation: Carpenter  Tobacco Use   Smoking status: Former    Current packs/day: 0.00    Average packs/day: 2.0 packs/day for 30.0 years (60.0 ttl pk-yrs)    Types: Cigarettes    Start date: 07/19/1968    Quit date: 07/19/1998    Years since quitting: 25.9   Smokeless tobacco: Never  Vaping Use   Vaping status: Never Used  Substance and Sexual Activity   Alcohol  use: Never   Drug use: Never   Sexual activity: Not on file  Other Topics Concern   Not on file  Social History Narrative   Right handed    Wear glasses    Drink coffee 3-4 per day          Pt lives with wife and works  part-time.   Social Drivers of Corporate investment banker Strain: Low Risk  (07/02/2024)   Overall Financial Resource Strain (CARDIA)    Difficulty of Paying Living Expenses: Not very hard  Food Insecurity: No Food Insecurity (07/02/2024)   Hunger Vital Sign    Worried About Running Out of Food in the Last Year: Never true    Ran Out of Food in the Last Year: Never true  Transportation Needs: No Transportation Needs (07/02/2024)   PRAPARE - Administrator, Civil Service (Medical): No    Lack of Transportation (Non-Medical): No  Physical Activity: Patient Declined (07/02/2024)   Exercise Vital Sign    Days of Exercise per Week: Patient declined    Minutes of Exercise per Session: Patient declined  Recent Concern: Physical Activity - Inactive (04/08/2024)   Exercise Vital Sign    Days of Exercise per Week: 0 days    Minutes of Exercise per Session: 0 min  Stress: No Stress Concern Present (07/02/2024)   [employer] of Occupational Health - Occupational Stress Questionnaire    Feeling of Stress: Not at all  Social Connections: Socially Integrated (07/02/2024)   Social Connection and Isolation Panel    Frequency of Communication with Friends and Family: More than three times a week     Frequency of Social Gatherings with Friends and Family: More than three times a week    Attends Religious Services: More than 4 times per year    Active Member of [employer] or Organizations: Yes    Attends Engineer, structural: More than 4 times per year    Marital Status: Married    Tobacco Counseling Counseling given: Not Answered    Clinical Intake:  Pre-visit preparation completed: Yes  Pain : 0-10 Pain Score: 6  Pain Type: Chronic pain Pain Location: Back Pain Orientation: Upper, Mid, Lower Pain Descriptors / Indicators: Aching Pain Onset: Today Pain Frequency: Several days a week     BMI - recorded: 26.22 Nutritional Risks: None Diabetes: Yes CBG done?: No Did pt. bring in CBG monitor from home?: No  Lab Results  Component Value Date   HGBA1C 5.8 (H) 06/02/2024   HGBA1C 7.6 (H) 11/05/2023     How often do you need to have someone help you when you read instructions, pamphlets, or other written materials from your doctor or pharmacy?: 1 - Never  Interpreter Needed?: No  Information entered by :: Donica Derouin<CMA   Activities of Daily Living     07/02/2024    4:01 PM  In your present state of health, do you have any difficulty performing the following activities:  Hearing? 0  Vision? 0  Difficulty concentrating or making decisions? 0  Walking or climbing stairs? 1  Dressing or bathing? 0  Doing errands, shopping? 0  Preparing Food and eating ? N  Using the Toilet? N  In the past six months, have you accidently leaked urine? N  Do you have problems with loss of bowel control? N  Managing your Medications? N  Managing your Finances? N  Housekeeping or managing your Housekeeping? N    Patient Care Team: Dottie Norleen PHEBE PONCE, MD as PCP - General (Family Medicine)  I have updated your Care Teams any recent Medical Services you may have received from other providers in the past year.     Assessment:   This is a routine wellness  examination for Troy Wu.  Hearing/Vision screen Hearing Screening - Comments:: Patient wears hearing aids  Vision Screening - Comments:: Patient wears glasses    Goals Addressed             This Visit's Progress    Patient Stated       Patient would like to try to stay healthy        Depression Screen     07/02/2024    4:03 PM 04/02/2024    4:01 PM  PHQ 2/9 Scores  PHQ - 2 Score 0 0  PHQ- 9 Score 0 3    Fall Risk     07/02/2024    4:02 PM 04/02/2024    4:01 PM  Fall Risk   Falls in the past year? 0 0  Number falls in past yr: 0 0  Injury with Fall? 0 0  Risk for fall due to : No Fall Risks No Fall Risks  Follow up Falls evaluation completed Falls evaluation completed    MEDICARE RISK AT HOME:  Medicare Risk at Home Any stairs in or around the home?: Yes If so, are there any without handrails?: No Home free of loose throw rugs in walkways, pet beds, electrical cords, etc?: Yes Adequate lighting in your home to reduce risk of falls?: Yes Life alert?: No Use of a cane, walker or w/c?: No Grab bars in the bathroom?: Yes Shower chair or bench in shower?: Yes Elevated toilet seat or a handicapped toilet?: Yes  TIMED UP AND GO:  Was the test performed?  No  Cognitive Function: 6CIT completed        07/02/2024    4:00 PM  6CIT Screen  What Year? 0 points  What month? 0 points  What time? 0 points  Count back from 20 0 points  Months in reverse 0 points  Repeat phrase 0 points  Total Score 0 points    Immunizations  There is no immunization history on file for this patient.  Screening Tests Health Maintenance  Topic Date Due   Pneumococcal Vaccine: 50+ Years (1 of 2 - PCV) Never done   Zoster Vaccines- Shingrix (1 of 2) Never done   DTaP/Tdap/Td (1 - Tdap) 07/27/2024 (Originally 06/01/1965)   HEMOGLOBIN A1C  11/30/2024   OPHTHALMOLOGY EXAM  12/18/2024   Diabetic kidney evaluation - eGFR measurement  06/02/2025   Diabetic kidney evaluation -  Urine ACR  06/02/2025   FOOT EXAM  06/26/2025   Medicare Annual Wellness (AWV)  07/02/2025   Meningococcal B Vaccine  Aged Out   Influenza Vaccine  Discontinued   COVID-19 Vaccine  Discontinued    Health Maintenance Items Addressed:patient declined   Additional Screening:  Vision Screening: Recommended annual ophthalmology exams for early detection of glaucoma and other disorders of the eye. Is the patient up to date with their annual eye exam?  Yes    Dental Screening: Recommended annual dental exams for proper oral hygiene  Community Resource Referral / Chronic Care Management: CRR required this visit?  No   CCM required this visit?  No   Plan:    I have personally reviewed and noted the following in the patient's chart:   Medical and social history Use of alcohol , tobacco or illicit drugs  Current medications and supplements including opioid prescriptions. Patient is not currently taking opioid prescriptions. Functional ability and status Nutritional status Physical activity Advanced directives List of other physicians Hospitalizations, surgeries, and ER visits in previous 12 months Vitals Screenings to include cognitive, depression, and falls Referrals and appointments  In addition, I have reviewed  and discussed with patient certain preventive protocols, quality metrics, and best practice recommendations. A written personalized care plan for preventive services as well as general preventive health recommendations were provided to patient.   Troy Wu Right, [place]   07/02/2024   After Visit Summary: (MyChart) Due to this being a telephonic visit, the after visit summary with patients personalized plan was offered to patient via MyChart   Notes: Nothing significant to report at this time.

## 2024-07-02 NOTE — Patient Instructions (Signed)
 Mr. Domagala,  Thank you for taking the time for your Medicare Wellness Visit. I appreciate your continued commitment to your health goals. Please review the care plan we discussed, and feel free to reach out if I can assist you further.  Medicare recommends these wellness visits once per year to help you and your care team stay ahead of potential health issues. These visits are designed to focus on prevention, allowing your provider to concentrate on managing your acute and chronic conditions during your regular appointments.  Please note that Annual Wellness Visits do not include a physical exam. Some assessments may be limited, especially if the visit was conducted virtually. If needed, we may recommend a separate in-person follow-up with your provider.  Ongoing Care Seeing your primary care provider every 3 to 6 months helps us  monitor your health and provide consistent, personalized care.   Referrals If a referral was made during today's visit and you haven't received any updates within two weeks, please contact the referred provider directly to check on the status.  Recommended Screenings:  Health Maintenance  Topic Date Due   Pneumococcal Vaccine for age over 55 (1 of 2 - PCV) Never done   Zoster (Shingles) Vaccine (1 of 2) Never done   DTaP/Tdap/Td vaccine (1 - Tdap) 07/27/2024*   Hemoglobin A1C  11/30/2024   Eye exam for diabetics  12/18/2024   Yearly kidney function blood test for diabetes  06/02/2025   Yearly kidney health urinalysis for diabetes  06/02/2025   Complete foot exam   06/26/2025   Medicare Annual Wellness Visit  07/02/2025   Meningitis B Vaccine  Aged Out   Flu Shot  Discontinued   COVID-19 Vaccine  Discontinued  *Topic was postponed. The date shown is not the original due date.       07/02/2024    4:02 PM  Advanced Directives  Does Patient Have a Medical Advance Directive? Yes  Type of Estate agent of [place];Living will  Does  patient want to make changes to medical advance directive? No - Patient declined  Copy of Healthcare Power of Attorney in Chart? No - copy requested   Advance Care Planning is important because it: Ensures you receive medical care that aligns with your values, goals, and preferences. Provides guidance to your family and loved ones, reducing the emotional burden of decision-making during critical moments.  Vision: Annual vision screenings are recommended for early detection of glaucoma, cataracts, and diabetic retinopathy. These exams can also reveal signs of chronic conditions such as diabetes and high blood pressure.  Dental: Annual dental screenings help detect early signs of oral cancer, gum disease, and other conditions linked to overall health, including heart disease and diabetes.  Please see the attached documents for additional preventive care recommendations.

## 2024-07-03 ENCOUNTER — Other Ambulatory Visit (HOSPITAL_BASED_OUTPATIENT_CLINIC_OR_DEPARTMENT_OTHER): Payer: Self-pay | Admitting: Family Medicine

## 2024-07-08 ENCOUNTER — Telehealth (HOSPITAL_BASED_OUTPATIENT_CLINIC_OR_DEPARTMENT_OTHER): Payer: Self-pay | Admitting: *Deleted

## 2024-07-08 NOTE — Telephone Encounter (Signed)
 Pt. Aware.

## 2024-07-16 ENCOUNTER — Other Ambulatory Visit (HOSPITAL_BASED_OUTPATIENT_CLINIC_OR_DEPARTMENT_OTHER): Payer: Self-pay

## 2024-07-17 ENCOUNTER — Other Ambulatory Visit (HOSPITAL_BASED_OUTPATIENT_CLINIC_OR_DEPARTMENT_OTHER): Payer: Self-pay

## 2024-07-17 ENCOUNTER — Ambulatory Visit (HOSPITAL_BASED_OUTPATIENT_CLINIC_OR_DEPARTMENT_OTHER): Admitting: Family Medicine

## 2024-07-17 VITALS — BP 172/69 | HR 74 | Temp 97.4°F | Resp 16 | Wt 181.7 lb

## 2024-07-17 DIAGNOSIS — I10 Essential (primary) hypertension: Secondary | ICD-10-CM

## 2024-07-17 DIAGNOSIS — E1165 Type 2 diabetes mellitus with hyperglycemia: Secondary | ICD-10-CM

## 2024-07-17 DIAGNOSIS — R052 Subacute cough: Secondary | ICD-10-CM

## 2024-07-17 MED ORDER — OLMESARTAN MEDOXOMIL 20 MG PO TABS
20.0000 mg | ORAL_TABLET | Freq: Every day | ORAL | 2 refills | Status: DC
  Filled 2024-07-17: qty 30, 30d supply, fill #0
  Filled 2024-08-18: qty 30, 30d supply, fill #1

## 2024-07-17 NOTE — Assessment & Plan Note (Signed)
 Clarify soon if he can afford a GLP-1.

## 2024-07-17 NOTE — Progress Notes (Signed)
 Established Patient Office Visit  Subjective   Patient ID: Troy Wu, male    DOB: Nov 14, 1945  Age: 78 y.o. MRN: [id number]  Chief Complaint  Patient presents with   Cough    Cough    He reports 2-3 mos of intermittent cough.  Largely nonproductive.  No other acute concerns.  History suggestive of an ACE cough and will therefore replace his Lisinopril .  His sugars remain too high despite the recent increase in his Metformin .  Cough    Past Medical History:  Diagnosis Date   Detached retina, left    x2   Diabetes mellitus with microalbuminuria (HCC)    Sees eye doctor annually   Dyslipidemia    Rx declined   Former smoker    30 pack year history before quitting 2000   GERD (gastroesophageal reflux disease)    Glaucoma    f/by eye doctor   Hard of hearing    wears bilateral hearing aids   Hypertension    Prostate cancer (HCC)    Distant past.  Known to Urology in [place] with rx declined   Sleep apnea    does not use CPAP    Outpatient Encounter Medications as of 07/17/2024  Medication Sig   amLODipine  (NORVASC ) 5 MG tablet Take 1 tablet (5 mg total) by mouth daily.   dorzolamide-timolol (COSOPT) 2-0.5 % ophthalmic solution Instill 1 drop into both eyes twice a day   doxazosin  (CARDURA ) 8 MG tablet Take 1 tablet (8 mg total) by mouth daily.   hydrALAZINE  (APRESOLINE ) 100 MG tablet Take 1 tablet (100 mg total) by mouth 3 (three) times daily with food.   latanoprost  (XALATAN ) 0.005 % ophthalmic solution Place 1 drop into both eyes at bedtime.   metFORMIN  (GLUCOPHAGE -XR) 500 MG 24 hr tablet Take 1 tablet (500 mg total) by mouth every evening.   olmesartan (BENICAR) 20 MG tablet Take 1 tablet (20 mg total) by mouth daily.   omeprazole  (PRILOSEC) 20 MG capsule Take 20 mg by mouth daily.   tamsulosin  (FLOMAX ) 0.4 MG CAPS capsule Take 0.4 mg by mouth daily.   [DISCONTINUED] lisinopril  (ZESTRIL ) 20 MG tablet Take 1 tablet (20 mg total) by mouth daily.   ALPHA LIPOIC  ACID PO Take by mouth. (Patient not taking: Reported on 07/02/2024)   aspirin  EC 81 MG tablet Take 1 tablet (81 mg total) by mouth daily. Swallow whole. (Patient not taking: Reported on 07/02/2024)   atorvastatin  (LIPITOR) 40 MG tablet TAKE 1 TABLET BY MOUTH EVERY DAY (Patient not taking: Reported on 07/02/2024)   brimonidine  (ALPHAGAN ) 0.2 % ophthalmic solution INSTILL 1 DROP INTO LEFT EYE EVERY MORNING   dutasteride  (AVODART ) 0.5 MG capsule TAKE 1 CAPSULE BY MOUTH EVERY DAY (Patient not taking: Reported on 07/02/2024)   [DISCONTINUED] Continuous Glucose Sensor (DEXCOM G7 SENSOR) MISC Change sensor every 10 days   [DISCONTINUED] glimepiride  (AMARYL ) 2 MG tablet Take 1 tablet (2 mg total) by mouth daily before breakfast.   No facility-administered encounter medications on file as of 07/17/2024.    Social History   Tobacco Use   Smoking status: Former    Current packs/day: 0.00    Average packs/day: 2.0 packs/day for 30.0 years (60.0 ttl pk-yrs)    Types: Cigarettes    Start date: 07/19/1968    Quit date: 07/19/1998    Years since quitting: 26.0   Smokeless tobacco: Never  Vaping Use   Vaping status: Never Used  Substance Use Topics   Alcohol  use: Never  Drug use: Never      Review of Systems  Respiratory:  Positive for cough.       Objective:     BP (!) 172/69 (BP Location: Left Arm, Patient Position: Standing, Cuff Size: Normal)   Pulse 74   Temp (!) 97.4 F (36.3 C) (Oral)   Resp 16   Wt 181 lb 11.2 oz (82.4 kg)   SpO2 95%   BMI 27.23 kg/m    Physical Exam Constitutional:      General: He is not in acute distress.    Appearance: He is not ill-appearing, toxic-appearing or diaphoretic.  HENT:     Right Ear: Tympanic membrane normal.     Left Ear: Tympanic membrane normal.     Nose: No rhinorrhea.     Right Sinus: No maxillary sinus tenderness or frontal sinus tenderness.     Left Sinus: No maxillary sinus tenderness or frontal sinus tenderness.      Mouth/Throat:     Pharynx: No oropharyngeal exudate or posterior oropharyngeal erythema.  Eyes:     Conjunctiva/sclera: Conjunctivae normal.  Cardiovascular:     Rate and Rhythm: Normal rate and regular rhythm.     Heart sounds: Normal heart sounds.  Pulmonary:     Breath sounds: Normal breath sounds.  Abdominal:     Palpations: Abdomen is soft.     Tenderness: There is no abdominal tenderness.  Musculoskeletal:     Cervical back: Normal range of motion and neck supple. No tenderness.  Lymphadenopathy:     Cervical: No cervical adenopathy.  Skin:    General: Skin is warm and dry.     Findings: No rash.  Neurological:     Mental Status: He is alert.      No results found for any visits on 07/17/24.    The ASCVD Risk score (Arnett DK, et al., 2019) failed to calculate for the following reasons:   The 2019 ASCVD risk score is only valid for ages 29 to 33    Assessment & Plan:  Subacute cough Assessment & Plan: Likely due to his ACE.  Alert us  if not considerably better in the coming week   Primary hypertension Assessment & Plan: Arrange for close follow up after starting the ARB.  Continue Amlodipine .  Orders: -     Olmesartan Medoxomil; Take 1 tablet (20 mg total) by mouth daily.  Dispense: 30 tablet; Refill: 2  Uncontrolled type 2 diabetes mellitus with hyperglycemia (HCC) Assessment & Plan: Clarify soon if he can afford a GLP-1.     No follow-ups on file.    REDDING PONCE NORLEEN FALCON., MD

## 2024-07-17 NOTE — Assessment & Plan Note (Signed)
 Arrange for close follow up after starting the ARB.  Continue Amlodipine .

## 2024-07-17 NOTE — Assessment & Plan Note (Signed)
 Likely due to his ACE.  Alert us  if not considerably better in the coming week

## 2024-08-04 ENCOUNTER — Telehealth (HOSPITAL_BASED_OUTPATIENT_CLINIC_OR_DEPARTMENT_OTHER): Payer: Self-pay | Admitting: Family Medicine

## 2024-08-04 ENCOUNTER — Other Ambulatory Visit (HOSPITAL_BASED_OUTPATIENT_CLINIC_OR_DEPARTMENT_OTHER): Payer: Self-pay

## 2024-08-05 ENCOUNTER — Other Ambulatory Visit (HOSPITAL_BASED_OUTPATIENT_CLINIC_OR_DEPARTMENT_OTHER): Payer: Self-pay

## 2024-08-05 NOTE — Progress Notes (Signed)
 Troy Wu                                          MRN: [id number]   08/05/2024   The [facility] Quality Team Specialist reviewed this patient medical record for the purposes of chart review for care gap closure. The following were reviewed: chart review for care gap closure-controlling blood pressure.    [facility] Quality Team

## 2024-08-12 ENCOUNTER — Other Ambulatory Visit (HOSPITAL_BASED_OUTPATIENT_CLINIC_OR_DEPARTMENT_OTHER): Payer: Self-pay

## 2024-08-18 ENCOUNTER — Other Ambulatory Visit (HOSPITAL_BASED_OUTPATIENT_CLINIC_OR_DEPARTMENT_OTHER): Payer: Self-pay

## 2024-08-19 ENCOUNTER — Other Ambulatory Visit (HOSPITAL_BASED_OUTPATIENT_CLINIC_OR_DEPARTMENT_OTHER): Payer: Self-pay

## 2024-08-20 ENCOUNTER — Other Ambulatory Visit (HOSPITAL_BASED_OUTPATIENT_CLINIC_OR_DEPARTMENT_OTHER): Payer: Self-pay

## 2024-08-20 DIAGNOSIS — R3912 Poor urinary stream: Secondary | ICD-10-CM | POA: Diagnosis not present

## 2024-08-20 DIAGNOSIS — N401 Enlarged prostate with lower urinary tract symptoms: Secondary | ICD-10-CM | POA: Diagnosis not present

## 2024-08-20 DIAGNOSIS — R3915 Urgency of urination: Secondary | ICD-10-CM | POA: Diagnosis not present

## 2024-08-20 DIAGNOSIS — C61 Malignant neoplasm of prostate: Secondary | ICD-10-CM | POA: Diagnosis not present

## 2024-08-20 DIAGNOSIS — R351 Nocturia: Secondary | ICD-10-CM | POA: Diagnosis not present

## 2024-08-20 MED ORDER — TADALAFIL 5 MG PO TABS
5.0000 mg | ORAL_TABLET | Freq: Every day | ORAL | 3 refills | Status: AC
  Filled 2024-08-20: qty 90, 90d supply, fill #0

## 2024-08-20 MED ORDER — TAMSULOSIN HCL 0.4 MG PO CAPS
0.4000 mg | ORAL_CAPSULE | Freq: Every day | ORAL | 3 refills | Status: DC
  Filled 2024-08-20: qty 90, 90d supply, fill #0

## 2024-08-21 ENCOUNTER — Other Ambulatory Visit (HOSPITAL_BASED_OUTPATIENT_CLINIC_OR_DEPARTMENT_OTHER): Payer: Self-pay

## 2024-08-21 ENCOUNTER — Encounter: Payer: Self-pay | Admitting: Urology

## 2024-08-21 ENCOUNTER — Other Ambulatory Visit: Payer: Self-pay | Admitting: Urology

## 2024-08-21 DIAGNOSIS — C61 Malignant neoplasm of prostate: Secondary | ICD-10-CM

## 2024-08-28 ENCOUNTER — Other Ambulatory Visit (HOSPITAL_BASED_OUTPATIENT_CLINIC_OR_DEPARTMENT_OTHER): Payer: Self-pay | Admitting: Family Medicine

## 2024-08-28 ENCOUNTER — Other Ambulatory Visit (HOSPITAL_BASED_OUTPATIENT_CLINIC_OR_DEPARTMENT_OTHER): Payer: Self-pay

## 2024-08-28 DIAGNOSIS — K219 Gastro-esophageal reflux disease without esophagitis: Secondary | ICD-10-CM

## 2024-08-28 DIAGNOSIS — I10 Essential (primary) hypertension: Secondary | ICD-10-CM

## 2024-08-28 MED ORDER — OMEPRAZOLE 20 MG PO CPDR
20.0000 mg | DELAYED_RELEASE_CAPSULE | Freq: Every day | ORAL | 2 refills | Status: AC
  Filled 2024-08-28: qty 90, 90d supply, fill #0

## 2024-08-28 MED ORDER — OLMESARTAN MEDOXOMIL 20 MG PO TABS
20.0000 mg | ORAL_TABLET | Freq: Every day | ORAL | 0 refills | Status: DC
  Filled 2024-08-28 – 2024-09-11 (×2): qty 90, 90d supply, fill #0

## 2024-08-28 NOTE — Telephone Encounter (Signed)
 Pt advised to make call eye doctor for lantoprost eye drops. He said he had been out for a week or so of acid reflux meds and a BP med. BP systolic was around 175-180's/ unsure of bottom number. I advised we had some IT issues and are resolving it now. Cough is somewhat better. Not completely gone. Pt complained he was going to leave doctor because his meds were not getting refilled. I explained again we were resolving these issues. He has an appt on 10/06/2024.

## 2024-08-29 NOTE — Telephone Encounter (Signed)
 Pt agreed to doing nurse visit for BP check. I apologized again for the incontinences it caused him and explain we did not deny his meds and were having IT issues.

## 2024-09-01 ENCOUNTER — Ambulatory Visit (HOSPITAL_BASED_OUTPATIENT_CLINIC_OR_DEPARTMENT_OTHER): Admitting: Family Medicine

## 2024-09-01 ENCOUNTER — Ambulatory Visit (HOSPITAL_BASED_OUTPATIENT_CLINIC_OR_DEPARTMENT_OTHER)

## 2024-09-01 NOTE — Progress Notes (Unsigned)
 Patient is in office today for a nurse visit for Blood Pressure Check. Patient blood pressure was 167/73, Patient none

## 2024-09-02 ENCOUNTER — Other Ambulatory Visit (HOSPITAL_BASED_OUTPATIENT_CLINIC_OR_DEPARTMENT_OTHER): Payer: Self-pay

## 2024-09-02 DIAGNOSIS — H401131 Primary open-angle glaucoma, bilateral, mild stage: Secondary | ICD-10-CM | POA: Diagnosis not present

## 2024-09-02 MED ORDER — DORZOLAMIDE HCL-TIMOLOL MAL 2-0.5 % OP SOLN
1.0000 [drp] | Freq: Two times a day (BID) | OPHTHALMIC | 3 refills | Status: DC
  Filled 2024-09-02 – 2024-09-15 (×2): qty 10, 50d supply, fill #0

## 2024-09-12 ENCOUNTER — Other Ambulatory Visit (HOSPITAL_BASED_OUTPATIENT_CLINIC_OR_DEPARTMENT_OTHER): Payer: Self-pay

## 2024-09-15 ENCOUNTER — Other Ambulatory Visit (HOSPITAL_BASED_OUTPATIENT_CLINIC_OR_DEPARTMENT_OTHER): Payer: Self-pay

## 2024-09-16 ENCOUNTER — Ambulatory Visit (INDEPENDENT_AMBULATORY_CARE_PROVIDER_SITE_OTHER): Admitting: Family Medicine

## 2024-09-16 ENCOUNTER — Other Ambulatory Visit (HOSPITAL_BASED_OUTPATIENT_CLINIC_OR_DEPARTMENT_OTHER): Payer: Self-pay

## 2024-09-16 VITALS — BP 132/72 | HR 68 | Temp 98.7°F | Resp 17 | Wt 184.5 lb

## 2024-09-16 DIAGNOSIS — I10 Essential (primary) hypertension: Secondary | ICD-10-CM

## 2024-09-16 DIAGNOSIS — J40 Bronchitis, not specified as acute or chronic: Secondary | ICD-10-CM | POA: Diagnosis not present

## 2024-09-16 DIAGNOSIS — J329 Chronic sinusitis, unspecified: Secondary | ICD-10-CM | POA: Diagnosis not present

## 2024-09-16 LAB — POC SOFIA 2 FLU + SARS ANTIGEN FIA
Influenza A, POC: NEGATIVE
Influenza B, POC: POSITIVE — AB
SARS Coronavirus 2 Ag: NEGATIVE

## 2024-09-16 MED ORDER — BENZONATATE 200 MG PO CAPS
200.0000 mg | ORAL_CAPSULE | Freq: Three times a day (TID) | ORAL | 0 refills | Status: AC | PRN
  Filled 2024-09-16: qty 20, 7d supply, fill #0

## 2024-09-16 MED ORDER — OSELTAMIVIR PHOSPHATE 75 MG PO CAPS
75.0000 mg | ORAL_CAPSULE | Freq: Two times a day (BID) | ORAL | 0 refills | Status: AC
  Filled 2024-09-16: qty 10, 5d supply, fill #0

## 2024-09-16 NOTE — Assessment & Plan Note (Signed)
 Blood pressure readings at home are not optimal, with a recent reading of 160 mmHg. Current medication is Benicar  (olmesartan ) 20 mg daily. Kidney function is normal. - Increased Benicar  to 40 mg daily - Advised to check blood pressure while standing - Advised to reduce salt intake -Encouraged a f/u Nursing BP check in the coming week.

## 2024-09-16 NOTE — Progress Notes (Signed)
 "  Established Patient Office Visit  Subjective   Patient ID: Troy Wu, male    DOB: 07-Dec-1945  Age: 78 y.o. MRN: [id number]  Chief Complaint  Patient presents with   URI    Cold symptoms    Discussed the use of AI scribe software for clinical note transcription with the patient, who gave verbal consent to proceed.  History of Present Illness Troy Wu is an 78 year old male with hypertension who presents with a cough and cold symptoms.  He has experienced a recent onset of cold or flu-like symptoms, beginning with a dry cough approximately three days ago. The cough has caused soreness in his stomach. No fever or productive cough.  He has a history of hypertension and is currently taking Benicar  (olmesartan ) at a dose of 20 mg daily. His home blood pressure readings are similar to those taken during the visit, which he considers normal for him, though they are not optimal.  We recently replaced his ACE and his history is suggestive that his ACE cough largely resolved before this illness.    Past Medical History:  Diagnosis Date   Detached retina, left    x2   Diabetes mellitus with microalbuminuria (HCC)    Sees eye doctor annually   Dyslipidemia    Rx declined   Former smoker    30 pack year history before quitting 2000   GERD (gastroesophageal reflux disease)    Glaucoma    f/by eye doctor   Hard of hearing    wears bilateral hearing aids   Hypertension    Prostate cancer (HCC)    Distant past.  Known to Urology in [place] with rx declined   Sleep apnea    does not use CPAP    Outpatient Encounter Medications as of 09/16/2024  Medication Sig   amLODipine  (NORVASC ) 5 MG tablet Take 1 tablet (5 mg total) by mouth daily.   benzonatate (TESSALON) 200 MG capsule Take 1 capsule (200 mg total) by mouth 3 (three) times daily as needed for cough.   brimonidine  (ALPHAGAN ) 0.2 % ophthalmic solution INSTILL 1 DROP INTO LEFT EYE EVERY MORNING    dorzolamide -timolol  (COSOPT ) 2-0.5 % ophthalmic solution Instill 1 drop into both eyes twice a day   dorzolamide -timolol  (COSOPT ) 2-0.5 % ophthalmic solution Instill 1 drop into both eyes twice a day   hydrALAZINE  (APRESOLINE ) 100 MG tablet Take 1 tablet (100 mg total) by mouth 3 (three) times daily with food.   latanoprost  (XALATAN ) 0.005 % ophthalmic solution Place 1 drop into both eyes at bedtime.   metFORMIN  (GLUCOPHAGE -XR) 500 MG 24 hr tablet Take 1 tablet (500 mg total) by mouth every evening.   olmesartan  (BENICAR ) 20 MG tablet Take 1 tablet (20 mg total) by mouth daily.   omeprazole  (PRILOSEC) 20 MG capsule Take 1 capsule (20 mg total) by mouth daily.   oseltamivir (TAMIFLU) 75 MG capsule Take 1 capsule (75 mg total) by mouth 2 (two) times daily.   tadalafil  (CIALIS ) 5 MG tablet Take 1 tablet (5 mg total) by mouth daily.   tamsulosin  (FLOMAX ) 0.4 MG CAPS capsule Take 0.4 mg by mouth daily.   [DISCONTINUED] tamsulosin  (FLOMAX ) 0.4 MG CAPS capsule Take 1 capsule (0.4 mg total) by mouth daily.   ALPHA LIPOIC ACID PO Take by mouth. (Patient not taking: Reported on 07/02/2024)   aspirin  EC 81 MG tablet Take 1 tablet (81 mg total) by mouth daily. Swallow whole. (Patient not taking: Reported on 07/02/2024)  atorvastatin  (LIPITOR) 40 MG tablet TAKE 1 TABLET BY MOUTH EVERY DAY (Patient not taking: Reported on 09/16/2024)   doxazosin  (CARDURA ) 8 MG tablet Take 1 tablet (8 mg total) by mouth daily.   dutasteride  (AVODART ) 0.5 MG capsule TAKE 1 CAPSULE BY MOUTH EVERY DAY (Patient not taking: Reported on 07/02/2024)   No facility-administered encounter medications on file as of 09/16/2024.    Social History[1]    ROS    Objective:     BP (!) 162/82 (BP Location: Right Arm, Patient Position: Standing, Cuff Size: Normal)   Pulse 68   Temp 98.7 F (37.1 C) (Oral)   Resp 17   Wt 184 lb 8 oz (83.7 kg)   SpO2 94%   BMI 27.65 kg/m    Physical Exam Constitutional:      General: He is  not in acute distress.    Appearance: He is not ill-appearing, toxic-appearing or diaphoretic.  HENT:     Right Ear: Tympanic membrane normal.     Left Ear: Tympanic membrane normal.     Nose: Congestion present. No rhinorrhea.     Right Sinus: No maxillary sinus tenderness or frontal sinus tenderness.     Left Sinus: No maxillary sinus tenderness or frontal sinus tenderness.     Mouth/Throat:     Pharynx: Posterior oropharyngeal erythema present. No oropharyngeal exudate.  Eyes:     Conjunctiva/sclera: Conjunctivae normal.  Cardiovascular:     Rate and Rhythm: Normal rate and regular rhythm.     Heart sounds: Normal heart sounds.  Pulmonary:     Breath sounds: Normal breath sounds.  Abdominal:     Palpations: Abdomen is soft.     Tenderness: There is no abdominal tenderness.  Musculoskeletal:     Cervical back: Normal range of motion and neck supple. No tenderness.  Lymphadenopathy:     Cervical: No cervical adenopathy.  Skin:    General: Skin is warm and dry.     Findings: No rash.  Neurological:     Mental Status: He is alert.      No results found for any visits on 09/16/24.    The ASCVD Risk score (Arnett DK, et al., 2019) failed to calculate for the following reasons:   The 2019 ASCVD risk score is only valid for ages 75 to 54   * - Cholesterol units were assumed    Assessment & Plan:   Assessment & Plan Primary hypertension Blood pressure readings at home are not optimal, with a recent reading of 160 mmHg. Current medication is Benicar  (olmesartan ) 20 mg daily. Kidney function is normal. - Increased Benicar  to 40 mg daily - Advised to check blood pressure while standing - Advised to reduce salt intake -Encouraged a f/u Nursing BP check in the coming week.    Sinobronchitis - Swabbed for COVID-19 and influenza.  Flu B positive.  Alert us  if not better in a few days. Orders:   POC SOFIA 2 FLU + SARS ANTIGEN FIA   oseltamivir (TAMIFLU) 75 MG capsule; Take  1 capsule (75 mg total) by mouth 2 (two) times daily.   benzonatate (TESSALON) 200 MG capsule; Take 1 capsule (200 mg total) by mouth 3 (three) times daily as needed for cough.      No follow-ups on file.    REDDING PONCE NORLEEN FALCON., MD    [1]  Social History Tobacco Use   Smoking status: Former    Current packs/day: 0.00    Average packs/day: 2.0 packs/day for  30.0 years (60.0 ttl pk-yrs)    Types: Cigarettes    Start date: 07/19/1968    Quit date: 07/19/1998    Years since quitting: 26.1   Smokeless tobacco: Never  Vaping Use   Vaping status: Never Used  Substance Use Topics   Alcohol  use: Never   Drug use: Never   "

## 2024-09-16 NOTE — Assessment & Plan Note (Signed)
-   Swabbed for COVID-19 and influenza.  Flu B positive.  Alert us  if not better in a few days. Orders:   POC SOFIA 2 FLU + SARS ANTIGEN FIA   oseltamivir (TAMIFLU) 75 MG capsule; Take 1 capsule (75 mg total) by mouth 2 (two) times daily.   benzonatate (TESSALON) 200 MG capsule; Take 1 capsule (200 mg total) by mouth 3 (three) times daily as needed for cough.

## 2024-10-06 ENCOUNTER — Ambulatory Visit (INDEPENDENT_AMBULATORY_CARE_PROVIDER_SITE_OTHER): Admitting: Family Medicine

## 2024-10-06 ENCOUNTER — Encounter (HOSPITAL_BASED_OUTPATIENT_CLINIC_OR_DEPARTMENT_OTHER): Payer: Self-pay | Admitting: Family Medicine

## 2024-10-06 VITALS — BP 182/79 | HR 76 | Temp 98.1°F | Resp 16 | Wt 185.4 lb

## 2024-10-06 DIAGNOSIS — R635 Abnormal weight gain: Secondary | ICD-10-CM | POA: Diagnosis not present

## 2024-10-06 DIAGNOSIS — E118 Type 2 diabetes mellitus with unspecified complications: Secondary | ICD-10-CM

## 2024-10-06 DIAGNOSIS — M5431 Sciatica, right side: Secondary | ICD-10-CM | POA: Diagnosis not present

## 2024-10-06 DIAGNOSIS — I10 Essential (primary) hypertension: Secondary | ICD-10-CM | POA: Diagnosis not present

## 2024-10-06 NOTE — Progress Notes (Signed)
 "  Established Patient Office Visit  Subjective   Patient ID: TAELYN NEMES, male    DOB: 13-Sep-1946  Age: 79 y.o. MRN: [id number]  Chief Complaint  Patient presents with   Follow-up    Follow-up     Discussed the use of AI scribe software for clinical note transcription with the patient, who gave verbal consent to proceed.  History of Present Illness DREON PINEDA is an 79 year old male with hypertension who presents for blood pressure management and unexpected weight gain.  He has experienced fluctuating blood pressure despite being on medication for the past 20 years. Currently, he is taking olmesartan , which was increased from 20 mg to 40 mg two weeks ago, with two 20 mg pills to achieve this dose. Additionally, he takes tamsulosin , one pill at bedtime, for his prostate.  He has gained approximately 15-18 pounds over the last three months, primarily in the abdominal area. He denies that this weight gain is related to holiday eating. He exercises indoors five days a week for about 30 minutes, performing activities such as 'up and down the chairs.'  He discontinued metformin  two weeks ago due to experiencing diarrhea 'about four or five times a day.'  He experiences pain from his knees up to his right hip and sometimes into his lower back. He has a history of back surgery performed by Doctor Burnetta, who 'went in and cleaned it out.' He is awaiting an MRI for his prostate scheduled for Friday. 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  Known to Urology in [place] with rx declined   Sleep apnea    CPAP declined    Outpatient Encounter Medications as of 10/06/2024  Medication Sig   amLODipine  (NORVASC ) 5 MG tablet Take 1 tablet (5 mg total) by mouth daily.   benzonatate  (TESSALON ) 200 MG capsule Take 1 capsule (200 mg total) by mouth 3 (three) times daily as needed for cough.   brimonidine  (ALPHAGAN ) 0.2 % ophthalmic solution INSTILL 1 DROP INTO LEFT EYE EVERY MORNING   dorzolamide -timolol  (COSOPT ) 2-0.5 % ophthalmic solution Instill 1 drop into both eyes twice a day   doxazosin  (CARDURA ) 8 MG tablet Take 1 tablet (8 mg total) by mouth daily.   latanoprost  (XALATAN ) 0.005 % ophthalmic solution Place 1 drop into both eyes at bedtime.   olmesartan  (BENICAR ) 40 MG tablet Take 40 mg by mouth daily.   omeprazole  (PRILOSEC) 20 MG capsule Take 1 capsule (20 mg total) by mouth daily.   oseltamivir  (TAMIFLU ) 75 MG capsule Take 1 capsule (75 mg total) by mouth 2 (two) times daily.   tadalafil  (CIALIS ) 5 MG tablet Take 1 tablet (5 mg total) by mouth daily.   tamsulosin  (FLOMAX ) 0.4 MG CAPS capsule Take 0.4 mg by mouth daily.   [  DISCONTINUED] dorzolamide -timolol  (COSOPT ) 2-0.5 % ophthalmic solution Instill 1 drop into both eyes twice a day   [DISCONTINUED] hydrALAZINE  (APRESOLINE ) 100 MG tablet Take 1 tablet (100 mg total) by mouth 3 (three) times daily with food.   [DISCONTINUED] olmesartan  (BENICAR ) 20 MG tablet Take 1 tablet (20 mg total) by mouth daily.   ALPHA LIPOIC ACID PO Take by mouth. (Patient not taking: Reported on 07/02/2024)   aspirin  EC 81 MG tablet Take 1 tablet (81 mg total) by mouth daily. Swallow whole. (Patient not taking: Reported on 07/02/2024)   atorvastatin  (LIPITOR) 40 MG tablet TAKE 1 TABLET BY MOUTH EVERY DAY (Patient not taking: Reported on 09/16/2024)   dutasteride   (AVODART ) 0.5 MG capsule TAKE 1 CAPSULE BY MOUTH EVERY DAY (Patient not taking: Reported on 07/02/2024)   metFORMIN  (GLUCOPHAGE -XR) 500 MG 24 hr tablet Take 1 tablet (500 mg total) by mouth every evening. (Patient not taking: Reported on 10/06/2024)   No facility-administered encounter medications on file as of 10/06/2024.    Social History[1]    Review of Systems  Constitutional:  Negative for diaphoresis, fever, malaise/fatigue and weight loss.  Respiratory:  Negative for cough, shortness of breath and wheezing.   Cardiovascular:  Negative for chest pain, palpitations, orthopnea, claudication, leg swelling and PND.      Objective:     BP (!) 182/79 (BP Location: Right Arm, Patient Position: Standing, Cuff Size: Normal)   Pulse 76   Temp 98.1 F (36.7 C) (Oral)   Resp 16   Wt 185 lb 6.4 oz (84.1 kg)   SpO2 95%   BMI 27.78 kg/m    Physical Exam Constitutional:      General: He is not in acute distress.    Appearance: Normal appearance.     Comments: Overweight  HENT:     Head: Normocephalic.  Cardiovascular:     Rate and Rhythm: Normal rate and regular rhythm.     Pulses: Normal pulses.     Heart sounds: Normal heart sounds.  Pulmonary:     Effort: Pulmonary effort is normal.     Breath sounds: Normal breath sounds.  Abdominal:     General: Bowel sounds are normal.     Palpations: Abdomen is soft.  Musculoskeletal:     Cervical back: Neck supple. No tenderness.     Right lower leg: No edema.     Left lower leg: No edema.     Comments: Mild lumbago with negative SLR on right.  Neurological:     General: No focal deficit present.     Mental Status: He is alert.      No results found for any visits on 10/06/24.    The ASCVD Risk score (Arnett DK, et al., 2019) failed to calculate for the following reasons:   The 2019 ASCVD risk score is only valid for ages 55 to 7   * - Cholesterol units were assumed    Assessment & Plan:   Assessment & Plan Type 2  diabetes mellitus with complications (HCC) Metformin  was discontinued due to gastrointestinal side effects (diarrhea). A1c will be rechecked to assess current glycemic control.  1800 ADA diet and continue low impact exercise. - Ordered A1c test. Orders:   Hemoglobin A1c  Weight gain He has only gained about 4 lbs in the past 3 months.  Thyroid  function will be checked to rule out hypothyroidism as a contributing factor.   - Ordered thyroid  function test. Orders:   TSH  Primary hypertension Blood pressure  remains variable despite current medication regimen. Recent increase in olmesartan  from 20 mg to 40 mg was implemented. Amlodipine  dose adjustment is considered to improve control. Discussed potential side effect of leg swelling with increased amlodipine  dose, which would obviously prompt a call to us .  Compression stockings recommended if mild swelling occurs. - Increased amlodipine  from 5 mg to 10 mg daily. - Instructed to monitor for leg swelling and report if it occurs. - Recommended wearing compression stockings if mild swelling occurs. - Instructed to update provider in the coming week after medication adjustment, or ideally return for a nursing BP check.    Sciatica of right side Chronic low back pain with sciatica, likely due to arthritis and secondary pinched nerve. Previous back surgery noted.  Discussed potential for physical therapy and cortisone injections as treatment options.  Cut back on the lifting at work if possible. - He declines the option of physical therapy for symptom management. - Discussed potential referral to spine specialist for cortisone injections if symptoms persist.        Return in about 3 months (around 01/04/2025) for chronic follow-up.    REDDING PONCE NORLEEN FALCON., MD    [1]  Social History Tobacco Use   Smoking status: Former    Current packs/day: 0.00    Average packs/day: 2.0 packs/day for 30.0 years (60.0 ttl pk-yrs)    Types: Cigarettes     Start date: 07/19/1968    Quit date: 07/19/1998    Years since quitting: 26.2   Smokeless tobacco: Never  Vaping Use   Vaping status: Never Used  Substance Use Topics   Alcohol  use: Never   Drug use: Never   "

## 2024-10-06 NOTE — Assessment & Plan Note (Signed)
 Metformin  was discontinued due to gastrointestinal side effects (diarrhea). A1c will be rechecked to assess current glycemic control.  1800 ADA diet and continue low impact exercise. - Ordered A1c test. Orders:   Hemoglobin A1c

## 2024-10-06 NOTE — Assessment & Plan Note (Signed)
 Blood pressure remains variable despite current medication regimen. Recent increase in olmesartan  from 20 mg to 40 mg was implemented. Amlodipine  dose adjustment is considered to improve control. Discussed potential side effect of leg swelling with increased amlodipine  dose, which would obviously prompt a call to us .  Compression stockings recommended if mild swelling occurs. - Increased amlodipine  from 5 mg to 10 mg daily. - Instructed to monitor for leg swelling and report if it occurs. - Recommended wearing compression stockings if mild swelling occurs. - Instructed to update provider in the coming week after medication adjustment, or ideally return for a nursing BP check.

## 2024-10-06 NOTE — Assessment & Plan Note (Signed)
 Chronic low back pain with sciatica, likely due to arthritis and secondary pinched nerve. Previous back surgery noted.  Discussed potential for physical therapy and cortisone injections as treatment options.  Cut back on the lifting at work if possible. - He declines the option of physical therapy for symptom management. - Discussed potential referral to spine specialist for cortisone injections if symptoms persist.

## 2024-10-07 ENCOUNTER — Other Ambulatory Visit (HOSPITAL_BASED_OUTPATIENT_CLINIC_OR_DEPARTMENT_OTHER): Payer: Self-pay | Admitting: Family Medicine

## 2024-10-07 ENCOUNTER — Ambulatory Visit (HOSPITAL_BASED_OUTPATIENT_CLINIC_OR_DEPARTMENT_OTHER): Payer: Self-pay | Admitting: Family Medicine

## 2024-10-07 ENCOUNTER — Other Ambulatory Visit (HOSPITAL_BASED_OUTPATIENT_CLINIC_OR_DEPARTMENT_OTHER): Payer: Self-pay

## 2024-10-07 DIAGNOSIS — E118 Type 2 diabetes mellitus with unspecified complications: Secondary | ICD-10-CM

## 2024-10-07 LAB — HEMOGLOBIN A1C
Est. average glucose Bld gHb Est-mCnc: 154 mg/dL
Hgb A1c MFr Bld: 7 % — ABNORMAL HIGH (ref 4.8–5.6)

## 2024-10-07 LAB — TSH: TSH: 1.67 u[IU]/mL (ref 0.450–4.500)

## 2024-10-07 MED ORDER — EMPAGLIFLOZIN 10 MG PO TABS
10.0000 mg | ORAL_TABLET | Freq: Every day | ORAL | 1 refills | Status: AC
  Filled 2024-10-07 – 2024-10-20 (×2): qty 30, 30d supply, fill #0

## 2024-10-10 ENCOUNTER — Ambulatory Visit
Admission: RE | Admit: 2024-10-10 | Discharge: 2024-10-10 | Disposition: A | Source: Ambulatory Visit | Attending: Urology

## 2024-10-10 DIAGNOSIS — C61 Malignant neoplasm of prostate: Secondary | ICD-10-CM

## 2024-10-10 MED ORDER — GADOPICLENOL 0.5 MMOL/ML IV SOLN
8.0000 mL | Freq: Once | INTRAVENOUS | Status: AC | PRN
  Administered 2024-10-10: 8 mL via INTRAVENOUS

## 2024-10-17 ENCOUNTER — Other Ambulatory Visit (HOSPITAL_BASED_OUTPATIENT_CLINIC_OR_DEPARTMENT_OTHER): Payer: Self-pay

## 2024-10-20 ENCOUNTER — Other Ambulatory Visit (HOSPITAL_BASED_OUTPATIENT_CLINIC_OR_DEPARTMENT_OTHER): Payer: Self-pay

## 2025-01-05 ENCOUNTER — Ambulatory Visit (HOSPITAL_BASED_OUTPATIENT_CLINIC_OR_DEPARTMENT_OTHER): Admitting: Family Medicine
